# Patient Record
Sex: Female | Born: 1990 | Race: White | Hispanic: No | Smoking: Never smoker
Health system: Southern US, Community
[De-identification: ages and names within clinical notes are randomized; demographics above are authoritative.]

## PROBLEM LIST (undated history)

## (undated) DIAGNOSIS — A749 Chlamydial infection, unspecified: Secondary | ICD-10-CM

## (undated) DIAGNOSIS — G43109 Migraine with aura, not intractable, without status migrainosus: Secondary | ICD-10-CM

## (undated) DIAGNOSIS — F329 Major depressive disorder, single episode, unspecified: Secondary | ICD-10-CM

## (undated) DIAGNOSIS — L309 Dermatitis, unspecified: Secondary | ICD-10-CM

## (undated) DIAGNOSIS — R51 Headache: Secondary | ICD-10-CM

## (undated) DIAGNOSIS — R519 Headache, unspecified: Secondary | ICD-10-CM

## (undated) DIAGNOSIS — F32A Depression, unspecified: Secondary | ICD-10-CM

## (undated) DIAGNOSIS — N750 Cyst of Bartholin's gland: Secondary | ICD-10-CM

## (undated) DIAGNOSIS — R87629 Unspecified abnormal cytological findings in specimens from vagina: Secondary | ICD-10-CM

## (undated) DIAGNOSIS — G43909 Migraine, unspecified, not intractable, without status migrainosus: Secondary | ICD-10-CM

## (undated) HISTORY — DX: Unspecified abnormal cytological findings in specimens from vagina: R87.629

## (undated) HISTORY — DX: Migraine with aura, not intractable, without status migrainosus: G43.109

## (undated) HISTORY — DX: Major depressive disorder, single episode, unspecified: F32.9

## (undated) HISTORY — PX: WISDOM TOOTH EXTRACTION: SHX21

## (undated) HISTORY — DX: Depression, unspecified: F32.A

---

## 2013-03-25 ENCOUNTER — Ambulatory Visit: Payer: BC Managed Care – PPO | Admitting: Family Medicine

## 2013-03-25 VITALS — BP 120/68 | HR 72 | Temp 98.3°F | Resp 16 | Ht 63.5 in | Wt 98.8 lb

## 2013-03-25 DIAGNOSIS — F32A Depression, unspecified: Secondary | ICD-10-CM | POA: Insufficient documentation

## 2013-03-25 DIAGNOSIS — F4010 Social phobia, unspecified: Secondary | ICD-10-CM

## 2013-03-25 DIAGNOSIS — F329 Major depressive disorder, single episode, unspecified: Secondary | ICD-10-CM

## 2013-03-25 DIAGNOSIS — F3289 Other specified depressive episodes: Secondary | ICD-10-CM

## 2013-03-25 MED ORDER — CITALOPRAM HYDROBROMIDE 20 MG PO TABS: 20.0000 mg | ORAL_TABLET | Freq: Every day | ORAL | Status: DC

## 2013-03-25 MED ORDER — CLONAZEPAM 0.5 MG PO TABS: 0.5000 mg | ORAL_TABLET | Freq: Two times a day (BID) | ORAL | Status: DC | PRN

## 2013-03-25 NOTE — Progress Notes (Signed)
Subjective: 23 year old lady who was referred over to urgent medical family care by Vernell LeepKen Frazier, licensed counselor. The patient has a long history of social anxiety and depression. She is very much a Haematologistloner. She does have a regular boyfriend who lives with her. They moved here from [place] several months ago. 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 She denies suicidal thoughts, though one time many years ago she made a token cutting on her arm. Does not smoke or use drugs. She has a brother, but does not really keep up with him. Her mother who she was not close to passed away last month, and her father lives somewhere in the [place] area but she doesn't really keep up with him either. She mostly goes to work and then go straight home and stays there. Rarely goes out with her boyfriend, and finds that even interacting with his friends is very anxiety provoking for her. She has not get much exercise. She does not have any spiritual background to speak of.  Objective: Appears generally healthy. She tears when cries easily. Her TMs are normal. Throat clear. Pupils a little dilated but constrict easily. Chest clear. Heart regular without murmurs.  Assessment: Social anxiety Depression  Plan: Citalopram 20 mg one daily Clonazepam 0.5 mg twice daily for short-term use only until citalopram is working well  Continue seeing a counselor  Followup here as needed or see me in 3 weeks

## 2013-03-25 NOTE — Patient Instructions (Signed)
Take the citalopram for anxiety and depression one every morning. It will take about 2 weeks before you note that it has seemed to improve things. This is the starting dose, and many do well on it, but I may need to titrate the dose him to your needs.  Take the clonazepam one half to one twice daily only when needed for acute anxieties. You might try using it in a little more regular fashion over the next couple of weeks until the citalopram can begin working.  Return at any time in the event of acute worsening. Or go to the emergency room or to your counselor.

## 2013-04-15 ENCOUNTER — Ambulatory Visit: Payer: BC Managed Care – PPO | Admitting: Family Medicine

## 2013-04-15 VITALS — BP 120/62 | HR 92 | Temp 98.6°F | Resp 16 | Ht 64.0 in | Wt 99.8 lb

## 2013-04-15 DIAGNOSIS — F3289 Other specified depressive episodes: Secondary | ICD-10-CM

## 2013-04-15 DIAGNOSIS — F329 Major depressive disorder, single episode, unspecified: Secondary | ICD-10-CM

## 2013-04-15 DIAGNOSIS — F411 Generalized anxiety disorder: Secondary | ICD-10-CM

## 2013-04-15 DIAGNOSIS — F32A Depression, unspecified: Secondary | ICD-10-CM

## 2013-04-15 NOTE — Patient Instructions (Addendum)
Exercise  Get enough sleep  See your counselor as scheduled  Return in 3 months.  Return sooner if needed.

## 2013-04-15 NOTE — Progress Notes (Signed)
Subjective: Patient is feeling better, though she still has some issues with anxiety and depression. She is just taking one half of a clonazepam at a time. She continues taking the citalopram, and says she is no longer crying. Her boyfriend says she's more outgoing. She is not exercising yet. We talked about that again.  Objective: Exam not done today  Assessment: Anxiety and depression, improved  Plan: Exercise Same medication. Did not give her any additional clonazepam at this time. Followup with her psychologist.

## 2013-07-29 ENCOUNTER — Ambulatory Visit (INDEPENDENT_AMBULATORY_CARE_PROVIDER_SITE_OTHER): Payer: BC Managed Care – PPO | Admitting: Family Medicine

## 2013-07-29 VITALS — BP 122/66 | HR 81 | Temp 98.0°F | Resp 16 | Ht 63.0 in | Wt 98.0 lb

## 2013-07-29 DIAGNOSIS — F32A Depression, unspecified: Secondary | ICD-10-CM

## 2013-07-29 DIAGNOSIS — F329 Major depressive disorder, single episode, unspecified: Secondary | ICD-10-CM

## 2013-07-29 DIAGNOSIS — F4010 Social phobia, unspecified: Secondary | ICD-10-CM

## 2013-07-29 DIAGNOSIS — F3289 Other specified depressive episodes: Secondary | ICD-10-CM

## 2013-07-29 MED ORDER — CITALOPRAM HYDROBROMIDE 20 MG PO TABS: ORAL_TABLET | ORAL | Status: DC

## 2013-07-29 MED ORDER — CLONAZEPAM 0.5 MG PO TABS: 0.5000 mg | ORAL_TABLET | Freq: Two times a day (BID) | ORAL | Status: DC | PRN

## 2013-07-29 NOTE — Patient Instructions (Signed)
Continue current use of the clonazepam only on extremely needed basis for anxiety  Increase the citalopram to 30 mg (one and 1/2x20 mg) daily  Continue seeing her therapist  If you're doing well return late September

## 2013-07-29 NOTE — Progress Notes (Signed)
Subjective: A says her depression is still bothering her. Anxiety is improved considerably. Her boyfriend feels like the depression is less pronounced than it was. She is not suicidal. She does have a hard time getting going in the morning. She is able to force himself to hurt up and she does well at work. Sleeps well. Says she may sleep too much. Continue seeing her counselor.   Objective: Not examined her today. We had a long talk.  Assessment: Anxiety and depression  Plan: Increase the citalopram to 30 mg daily. She is concerned she doesn't have good appetite. If it persists we may have to switch her to Paxil. I don't want to gain a lot of weight. Return in 3 months

## 2013-12-01 ENCOUNTER — Other Ambulatory Visit: Payer: Self-pay | Admitting: Family Medicine

## 2015-02-05 NOTE — L&D Delivery Note (Signed)
Operative Delivery Note At 1:52 AM a viable female was delivered via Vaginal, Vacuum (Kiwi).  Presentation: vertex; Position: Right,, Occiput,, Anterior.  Verbal consent: obtained from patient.  Risks and benefits discussed in detail.  Risks include, but are not limited to the risks of anesthesia, bleeding, infection, damage to maternal tissues, fetal cephalhematoma.  There is also the risk of inability to effect vaginal delivery of the head, or shoulder dystocia that cannot be resolved by established maneuvers, leading to the need for emergency cesarean section.  Vacuum assisted x 3 sets.  No pop offs.  No scalp lacerations. APGAR: 9, 9; weight pending .   Placenta status:Intact, 3VC , .   Cord:  with the following complications:Tight right foot cord .  Cord pH: n/a  Anesthesia:  Epidural Instruments: Kiwi Episiotomy: None Lacerations: 2nd degree;Periurethral (actively bleeding) Suture Repair: 3.0 vicryl Est. Blood Loss (mL): 250  Mom to postpartum.  Baby to Couplet care / Skin to Skin.  Geryl RankinsVARNADO, Raesean Bartoletti 11/23/2015, 2:47 AM

## 2015-11-22 ENCOUNTER — Encounter (HOSPITAL_COMMUNITY): Payer: Self-pay | Admitting: *Deleted

## 2015-11-22 ENCOUNTER — Inpatient Hospital Stay (HOSPITAL_COMMUNITY): Payer: BLUE CROSS/BLUE SHIELD | Admitting: Anesthesiology

## 2015-11-22 ENCOUNTER — Inpatient Hospital Stay (HOSPITAL_COMMUNITY)
Admission: AD | Admit: 2015-11-22 | Discharge: 2015-11-24 | DRG: 775 | Disposition: A | Discharge status: Home / Self Care | Payer: BLUE CROSS/BLUE SHIELD | Source: Ambulatory Visit | Attending: Obstetrics and Gynecology | Admitting: Obstetrics and Gynecology

## 2015-11-22 DIAGNOSIS — O99824 Streptococcus B carrier state complicating childbirth: Secondary | ICD-10-CM | POA: Diagnosis present

## 2015-11-22 DIAGNOSIS — Z6791 Unspecified blood type, Rh negative: Secondary | ICD-10-CM

## 2015-11-22 DIAGNOSIS — Z3403 Encounter for supervision of normal first pregnancy, third trimester: Secondary | ICD-10-CM | POA: Diagnosis present

## 2015-11-22 DIAGNOSIS — D649 Anemia, unspecified: Secondary | ICD-10-CM | POA: Diagnosis present

## 2015-11-22 DIAGNOSIS — O9081 Anemia of the puerperium: Secondary | ICD-10-CM | POA: Diagnosis present

## 2015-11-22 DIAGNOSIS — Z3A39 39 weeks gestation of pregnancy: Secondary | ICD-10-CM

## 2015-11-22 DIAGNOSIS — O26893 Other specified pregnancy related conditions, third trimester: Secondary | ICD-10-CM | POA: Diagnosis present

## 2015-11-22 HISTORY — DX: Chlamydial infection, unspecified: A74.9

## 2015-11-22 HISTORY — DX: Cyst of Bartholin's gland: N75.0

## 2015-11-22 HISTORY — DX: Headache: R51

## 2015-11-22 HISTORY — DX: Headache, unspecified: R51.9

## 2015-11-22 HISTORY — DX: Dermatitis, unspecified: L30.9

## 2015-11-22 LAB — CBC
HEMATOCRIT: 32.3 % — AB (ref 36.0–46.0)
HEMOGLOBIN: 10.9 g/dL — AB (ref 12.0–15.0)
MCH: 27.3 pg (ref 26.0–34.0)
MCHC: 33.7 g/dL (ref 30.0–36.0)
MCV: 81 fL (ref 78.0–100.0)
Platelets: 218 10*3/uL (ref 150–400)
RBC: 3.99 MIL/uL (ref 3.87–5.11)
RDW: 13.9 % (ref 11.5–15.5)
WBC: 12.7 10*3/uL — ABNORMAL HIGH (ref 4.0–10.5)

## 2015-11-22 LAB — OB RESULTS CONSOLE GC/CHLAMYDIA
Chlamydia: NEGATIVE
Gonorrhea: NEGATIVE

## 2015-11-22 LAB — OB RESULTS CONSOLE HIV ANTIBODY (ROUTINE TESTING): HIV: NONREACTIVE

## 2015-11-22 LAB — OB RESULTS CONSOLE GBS: GBS: POSITIVE

## 2015-11-22 LAB — OB RESULTS CONSOLE HEPATITIS B SURFACE ANTIGEN: Hepatitis B Surface Ag: NEGATIVE

## 2015-11-22 LAB — AMNISURE RUPTURE OF MEMBRANE (ROM) NOT AT ARMC: Amnisure ROM: NEGATIVE

## 2015-11-22 LAB — OB RESULTS CONSOLE RPR: RPR: NONREACTIVE

## 2015-11-22 LAB — OB RESULTS CONSOLE RUBELLA ANTIBODY, IGM: Rubella: IMMUNE

## 2015-11-22 LAB — OB RESULTS CONSOLE ANTIBODY SCREEN: Antibody Screen: NEGATIVE

## 2015-11-22 LAB — OB RESULTS CONSOLE ABO/RH: RH Type: NEGATIVE

## 2015-11-22 MED ORDER — PHENYLEPHRINE 40 MCG/ML (10ML) SYRINGE FOR IV PUSH (FOR BLOOD PRESSURE SUPPORT)
80.0000 ug | PREFILLED_SYRINGE | INTRAVENOUS | Status: DC | PRN
  Filled 2015-11-22: qty 10
  Filled 2015-11-22: qty 5

## 2015-11-22 MED ORDER — EPHEDRINE 5 MG/ML INJ
10.0000 mg | INTRAVENOUS | Status: DC | PRN
  Filled 2015-11-22: qty 4

## 2015-11-22 MED ORDER — OXYTOCIN 40 UNITS IN LACTATED RINGERS INFUSION - SIMPLE MED
2.5000 [IU]/h | INTRAVENOUS | Status: DC
  Administered 2015-11-23: 2.5 [IU]/h via INTRAVENOUS
  Filled 2015-11-22: qty 1000

## 2015-11-22 MED ORDER — ACETAMINOPHEN 325 MG PO TABS: 650.0000 mg | ORAL_TABLET | ORAL | Status: DC | PRN

## 2015-11-22 MED ORDER — LIDOCAINE HCL (PF) 1 % IJ SOLN
INTRAMUSCULAR | Status: DC | PRN
  Administered 2015-11-22 (×2): 7 mL via EPIDURAL

## 2015-11-22 MED ORDER — SOD CITRATE-CITRIC ACID 500-334 MG/5ML PO SOLN: 30.0000 mL | ORAL | Status: DC | PRN

## 2015-11-22 MED ORDER — OXYCODONE-ACETAMINOPHEN 5-325 MG PO TABS
2.0000 | ORAL_TABLET | ORAL | Status: DC | PRN
  Administered 2015-11-23: 2 via ORAL
  Filled 2015-11-22: qty 2

## 2015-11-22 MED ORDER — LIDOCAINE HCL (PF) 1 % IJ SOLN
30.0000 mL | INTRAMUSCULAR | Status: DC | PRN
  Filled 2015-11-22: qty 30

## 2015-11-22 MED ORDER — LACTATED RINGERS IV SOLN
500.0000 mL | Freq: Once | INTRAVENOUS | Status: AC
  Administered 2015-11-22: 500 mL via INTRAVENOUS

## 2015-11-22 MED ORDER — FENTANYL 2.5 MCG/ML BUPIVACAINE 1/10 % EPIDURAL INFUSION (WH - ANES)
14.0000 mL/h | INTRAMUSCULAR | Status: DC | PRN
  Administered 2015-11-22 (×3): 14 mL/h via EPIDURAL
  Filled 2015-11-22 (×2): qty 125

## 2015-11-22 MED ORDER — PENICILLIN G POTASSIUM 5000000 UNITS IJ SOLR
5.0000 10*6.[IU] | Freq: Once | INTRAVENOUS | Status: AC
  Administered 2015-11-22: 5 10*6.[IU] via INTRAVENOUS
  Filled 2015-11-22: qty 5

## 2015-11-22 MED ORDER — OXYCODONE-ACETAMINOPHEN 5-325 MG PO TABS: 1.0000 | ORAL_TABLET | ORAL | Status: DC | PRN

## 2015-11-22 MED ORDER — LACTATED RINGERS IV SOLN
500.0000 mL | INTRAVENOUS | Status: DC | PRN
  Administered 2015-11-22: 1000 mL via INTRAVENOUS

## 2015-11-22 MED ORDER — PENICILLIN G POTASSIUM 5000000 UNITS IJ SOLR
2.5000 10*6.[IU] | INTRAVENOUS | Status: DC
  Administered 2015-11-22 – 2015-11-23 (×3): 2.5 10*6.[IU] via INTRAVENOUS
  Filled 2015-11-22 (×6): qty 2.5

## 2015-11-22 MED ORDER — PHENYLEPHRINE 40 MCG/ML (10ML) SYRINGE FOR IV PUSH (FOR BLOOD PRESSURE SUPPORT)
80.0000 ug | PREFILLED_SYRINGE | INTRAVENOUS | Status: DC | PRN
  Filled 2015-11-22: qty 5

## 2015-11-22 MED ORDER — DIPHENHYDRAMINE HCL 50 MG/ML IJ SOLN: 12.5000 mg | INTRAMUSCULAR | Status: DC | PRN

## 2015-11-22 MED ORDER — ONDANSETRON HCL 4 MG/2ML IJ SOLN
4.0000 mg | Freq: Four times a day (QID) | INTRAMUSCULAR | Status: DC | PRN
  Administered 2015-11-22: 4 mg via INTRAVENOUS
  Filled 2015-11-22: qty 2

## 2015-11-22 MED ORDER — OXYTOCIN BOLUS FROM INFUSION
500.0000 mL | Freq: Once | INTRAVENOUS | Status: AC
  Administered 2015-11-23: 500 mL via INTRAVENOUS

## 2015-11-22 MED ORDER — LACTATED RINGERS IV SOLN
INTRAVENOUS | Status: DC
  Administered 2015-11-22: 14:00:00 via INTRAVENOUS

## 2015-11-22 NOTE — Progress Notes (Signed)
Dr Normand Sloopillard notified of pt's VE, orders received to admit pt

## 2015-11-22 NOTE — Progress Notes (Signed)
Rebekah [name] MRN: [id number]  Subjective: -Pushing with patient.  Multiple position changes.  Good efforts made.  Objective: BP 119/66   Pulse 94   Temp 98 F (36.7 C) (Oral)   Resp 16   Ht 5\' 3"  (1.6 m)   Wt 63 kg (139 lb) Comment: in office  LMP 02/19/2015   SpO2 98%   BMI 24.62 kg/m  No intake/output data recorded. Total I/O In: -  Out: 600 [Urine:600]  Fetal Monitoring: FHT: 140 bpm, Mod Var, +Early Decels, +Accels UC: Palpates moderate, Q722minutes    Vaginal Exam: SVE:   Dilation: 10 Effacement (%): 100 Station: +1, +2 Exam by:: Sabas SousJ Beckham Capistran, CNM Membranes:AROM Internal Monitors: None  Augmentation/Induction: Pitocin:None Cytotec: None  Assessment:  IUP at 39.3wks Cat I FT  2nd Stage Labor GBS Positive  Plan: -Continue pushing -Rest when necessary -Ice chips as needed -Continue other mgmt as ordered  Valma CavaJessica L Trestan Vahle,MSN, CNM 11/22/2015, 11:25 PM

## 2015-11-22 NOTE — Anesthesia Pain Management Evaluation Note (Signed)
  [facility] Pain Management Visit Note  Patient: Jarold Songicole Bardales, 25 y.o., female  "Hello I am a member of the anesthesia team at [facility]. We have an anesthesia team available at all times to provide care throughout the hospital, including epidural management and anesthesia for C-section. I don't know your plan for the delivery whether it a natural birth, water birth, IV sedation, nitrous supplementation, doula or epidural, but we want to meet your pain goals."   1.Was your pain managed to your expectations on prior hospitalizations?   No prior hospitalizations  2.What is your expectation for pain management during this hospitalization?     Epidural  3.How can we help you reach that goal? Epidural when pt ready  Record the patient's initial score and the patient's pain goal.   Pain: 6  Pain Goal: 7 [facility] wants you to be able to say your pain was always managed very well.  Edison PaceWILKERSON,Jarrod Bodkins 11/22/2015

## 2015-11-22 NOTE — Anesthesia Preprocedure Evaluation (Addendum)
Anesthesia Evaluation  Patient identified by MRN, date of birth, ID band Patient awake    Reviewed: Allergy & Precautions, H&P , NPO status , Patient's Chart, lab work & pertinent test results  Airway Mallampati: I  TM Distance: >3 FB Neck ROM: full    Dental no notable dental hx.    Pulmonary neg pulmonary ROS,    Pulmonary exam normal        Cardiovascular negative cardio ROS Normal cardiovascular exam     Neuro/Psych    GI/Hepatic negative GI ROS, Neg liver ROS,   Endo/Other  negative endocrine ROS  Renal/GU negative Renal ROS     Musculoskeletal   Abdominal Normal abdominal exam  (+)   Peds  Hematology negative hematology ROS (+)   Anesthesia Other Findings   Reproductive/Obstetrics (+) Pregnancy                             Anesthesia Physical Anesthesia Plan  ASA: II  Anesthesia Plan: Epidural   Post-op Pain Management:    Induction:   Airway Management Planned:   Additional Equipment:   Intra-op Plan:   Post-operative Plan:   Informed Consent: I have reviewed the patients History and Physical, chart, labs and discussed the procedure including the risks, benefits and alternatives for the proposed anesthesia with the patient or authorized representative who has indicated his/her understanding and acceptance.     Plan Discussed with:   Anesthesia Plan Comments:         Anesthesia Quick Evaluation  

## 2015-11-22 NOTE — Progress Notes (Signed)
Jarold Songicole Quirion MRN: [id number]  Subjective: -Care assumed of 25 y.o. G1P0 at 39.3wks who presented in active labor.  Patient GBS positive and labor has been progressive without intervention with exception of AROM at 1700. In room to meet acquaintance and discuss POC for tonight.  Patient reports no concerns and denies current pressure or discomfort.   Objective: BP 117/67   Pulse 80   Temp 98 F (36.7 C) (Oral)   Resp 18   Ht 5\' 3"  (1.6 m)   Wt 63 kg (139 lb) Comment: in office  LMP 02/19/2015   SpO2 98%   BMI 24.62 kg/m  No intake/output data recorded. No intake/output data recorded.  Fetal Monitoring: FHT: 135 bpm, Mod Var, -Decels, +Accels UC: Palpates moderate    Vaginal Exam: SVE:   Dilation: 9 Effacement (%): 90 Station: +1 Exam by:: J.Thornton, RN  Membranes:AROM Internal Monitors: None  Augmentation/Induction: Pitocin:None Cytotec: None  Assessment:  IUP at 39.3wks Cat I FT  GBS Positive Transitional Labor  Plan: -Adjust toco to attempt to graph contractions -Place 'peanut' to promote fetal descent and rotation -Continue other mgmt as ordered -Anticipate SVD  Valma CavaJessica L Lashelle Koy,MSN, CNM 11/22/2015, 7:49 PM

## 2015-11-22 NOTE — H&P (Signed)
Rebekah [name] is [name] 25 y.o. female presenting for labor. OB History    Gravida Para Term Preterm AB Living   1 0 0 0 0 0   SAB TAB Ectopic Multiple Live Births   0 0 0 0 0     Past Medical History:  Diagnosis Date  . Bartholin's cyst   . Chlamydia   . Depression   . Eczema   . Headache    Past Surgical History:  Procedure Laterality Date  . WISDOM TOOTH EXTRACTION     Family History: family history includes Cancer in her mother. Social History:  reports that she has never smoked. She has never used smokeless tobacco. She reports that she does not drink alcohol or use drugs.     Maternal Diabetes: No Genetic Screening: Declined Maternal Ultrasounds/Referrals: Normal Fetal Ultrasounds or other Referrals:  None Maternal Substance Abuse:  No Significant Maternal Medications:  None Significant Maternal Lab Results:  Lab values include: Rh negative Other Comments:  None  ROS History Dilation: 4.5 Effacement (%): 90 Station: -3 Exam by:: Ginger Morris Rn Blood pressure 127/72, pulse 77, temperature 98.2 F (36.8 C), temperature source Oral, resp. rate 18, height 5\' 3"  (1.6 m), weight 139 lb (63 kg), last menstrual period 02/19/2015, SpO2 98 %. Exam Physical Exam  Physical Examination: General appearance - alert, well appearing, and in no distress Chest - clear to auscultation, no wheezes, rales or rhonchi, symmetric air entry Heart - normal rate and regular rhythm Abdomen - soft, nontender, nondistended, no masses or organomegaly Extremities - peripheral pulses normal, no pedal edema, no clubbing or cyanosis, Homan's sign negative bilaterally Prenatal labs: ABO, Rh: B/Negative/-- (10/18 1219) Antibody: Negative (10/18 1219) Rubella: Immune (10/18 1219) RPR: Nonreactive (10/18 1219)  HBsAg: Negative (10/18 1219)  HIV: Non-reactive (10/18 1219)  GBS: Positive (10/18 1219)   Assessment/Plan: Term in labor Epidural for pain Anticipate SVD PCN for  GBS   Rebekah [name] 11/22/2015, 3:01 PM

## 2015-11-22 NOTE — MAU Note (Addendum)
Pt sent from office, has been contracting since 0100, cx was 3+/100/-2 with a BBOW.  Pos GBS.  Spoke with Dr Normand Sloopillard regarding POC, do amnisure, if neg pt to walk and be rechecked.  Reports increase in d/c, and blood streaked mucous noted after exam.

## 2015-11-22 NOTE — Anesthesia Procedure Notes (Signed)
Epidural Patient location during procedure: OB Start time: 11/22/2015 2:25 PM End time: 11/22/2015 2:31 PM  Staffing Anesthesiologist: Leilani AbleHATCHETT, Modesty Rudy Performed: anesthesiologist   Preanesthetic Checklist Completed: patient identified, surgical consent, pre-op evaluation, timeout performed, IV checked, risks and benefits discussed and monitors and equipment checked  Epidural Patient position: sitting Prep: site prepped and draped and DuraPrep Patient monitoring: continuous pulse ox and blood pressure Approach: midline Location: L3-L4 Injection technique: LOR air  Needle:  Needle type: Tuohy  Needle gauge: 17 G Needle length: 9 cm and 9 Needle insertion depth: 5 cm cm Catheter type: closed end flexible Catheter size: 19 Gauge Catheter at skin depth: 10 cm Test dose: negative and Other  Assessment Sensory level: T9 Events: blood not aspirated, injection not painful, no injection resistance, negative IV test and no paresthesia  Additional Notes Reason for block:procedure for pain

## 2015-11-22 NOTE — Progress Notes (Signed)
Rebekah [name] MRN: [id number]  Subjective: -Nurse call reports patient with c/o constant pressure and C/C/+2 exam. In room to assess. Patient resting in bed.   Objective: BP 119/66   Pulse 94   Temp 97.7 F (36.5 C) (Oral)   Resp 16   Ht 5\' 3"  (1.6 m)   Wt 63 kg (139 lb) Comment: in office  LMP 02/19/2015   SpO2 98%   BMI 24.62 kg/m  No intake/output data recorded. Total I/O In: -  Out: 600 [Urine:600]  Fetal Monitoring: FHT: 145 bpm, Mod Var, -Decels, +Accels UC: Palpates moderate    Vaginal Exam: SVE:   Dilation: 10 Effacement (%): 100 Station: +1, +2 Exam by:: Rebekah [name], CNM Membranes:AROM Internal Monitors: None  Augmentation/Induction: Pitocin:None Cytotec: None  Assessment:  IUP at 39.3wks Cat I FT  2nd Stage Labor GBS Positive S/P 3 Doses PCN  Plan: -Practice pushes completed with minimal effort -Place in high fowlers and allow to labor down x 30 to 60 minutes -Continue other mgmt as ordered  Rebekah CavaJessica L Luretta Everly,MSN, CNM 11/22/2015, 9:44 PM

## 2015-11-22 NOTE — Progress Notes (Signed)
Patient ID: Rebekah [name], female   DOB: [birth year], 25 y.o.   MRN: [id number]  BP 124/64   Pulse 73   Temp 98.2 F (36.8 C) (Oral)   Resp 18   Ht 5\' 3"  (1.6 m)   Wt 139 lb (63 kg) Comment: in office  LMP 02/19/2015   SpO2 98%   BMI 24.62 kg/m  Cat 1 6/90/0 AROM CLEAR Comfortable with epidural Anticipate SVD

## 2015-11-22 NOTE — Anesthesia Procedure Notes (Signed)
Epidural Patient location during procedure: OB Start time: 11/22/2015 3:25 PM End time: 11/22/2015 3:30 PM  Staffing Anesthesiologist: Jennel Mara Performed: anesthesiologist   Preanesthetic Checklist Completed: patient identified, surgical consent, pre-op evaluation, timeout performed, IV checked, risks and benefits discussed and monitors and equipment checked  Epidural Patient position: sitting Prep: site prepped and draped and DuraPrep Patient monitoring: continuous pulse ox and blood pressure Approach: midline Location: L3-L4 Injection technique: LOR air  Needle:  Needle type: Tuohy  Needle gauge: 17 G Needle length: 9 cm and 9 Needle insertion depth: 5 cm cm Catheter type: closed end flexible Catheter size: 19 Gauge Catheter at skin depth: 10 cm Test dose: negative and Other  Assessment Sensory level: T9 Events: blood not aspirated, injection not painful, no injection resistance, negative IV test and no paresthesia  Additional Notes Reason for block:procedure for pain     

## 2015-11-23 ENCOUNTER — Encounter (HOSPITAL_COMMUNITY): Payer: Self-pay

## 2015-11-23 LAB — CBC
HEMATOCRIT: 27.4 % — AB (ref 36.0–46.0)
HEMOGLOBIN: 9.4 g/dL — AB (ref 12.0–15.0)
MCH: 27.8 pg (ref 26.0–34.0)
MCHC: 34.3 g/dL (ref 30.0–36.0)
MCV: 81.1 fL (ref 78.0–100.0)
PLATELETS: 209 10*3/uL (ref 150–400)
RBC: 3.38 MIL/uL — AB (ref 3.87–5.11)
RDW: 14 % (ref 11.5–15.5)
WBC: 23.4 10*3/uL — AB (ref 4.0–10.5)

## 2015-11-23 LAB — RPR: RPR: NONREACTIVE

## 2015-11-23 MED ORDER — DIBUCAINE 1 % RE OINT: 1.0000 "application " | TOPICAL_OINTMENT | RECTAL | Status: DC | PRN

## 2015-11-23 MED ORDER — TERBUTALINE SULFATE 1 MG/ML IJ SOLN
0.2500 mg | Freq: Once | INTRAMUSCULAR | Status: DC | PRN
  Filled 2015-11-23: qty 1

## 2015-11-23 MED ORDER — SENNOSIDES-DOCUSATE SODIUM 8.6-50 MG PO TABS
2.0000 | ORAL_TABLET | ORAL | Status: DC
  Administered 2015-11-24: 2 via ORAL
  Filled 2015-11-23: qty 2

## 2015-11-23 MED ORDER — ACETAMINOPHEN 325 MG PO TABS: 650.0000 mg | ORAL_TABLET | ORAL | Status: DC | PRN

## 2015-11-23 MED ORDER — PRENATAL MULTIVITAMIN CH
1.0000 | ORAL_TABLET | Freq: Every day | ORAL | Status: DC
  Administered 2015-11-23 – 2015-11-24 (×2): 1 via ORAL
  Filled 2015-11-23 (×2): qty 1

## 2015-11-23 MED ORDER — COCONUT OIL OIL
1.0000 "application " | TOPICAL_OIL | Status: DC | PRN
  Administered 2015-11-24: 1 via TOPICAL
  Filled 2015-11-23: qty 120

## 2015-11-23 MED ORDER — WITCH HAZEL-GLYCERIN EX PADS: 1.0000 "application " | MEDICATED_PAD | CUTANEOUS | Status: DC | PRN

## 2015-11-23 MED ORDER — ONDANSETRON HCL 4 MG PO TABS: 4.0000 mg | ORAL_TABLET | ORAL | Status: DC | PRN

## 2015-11-23 MED ORDER — ZOLPIDEM TARTRATE 5 MG PO TABS: 5.0000 mg | ORAL_TABLET | Freq: Every evening | ORAL | Status: DC | PRN

## 2015-11-23 MED ORDER — RHO D IMMUNE GLOBULIN 1500 UNIT/2ML IJ SOSY
300.0000 ug | PREFILLED_SYRINGE | Freq: Once | INTRAMUSCULAR | Status: AC
  Administered 2015-11-23: 300 ug via INTRAVENOUS
  Filled 2015-11-23: qty 2

## 2015-11-23 MED ORDER — DIPHENHYDRAMINE HCL 25 MG PO CAPS: 25.0000 mg | ORAL_CAPSULE | Freq: Four times a day (QID) | ORAL | Status: DC | PRN

## 2015-11-23 MED ORDER — BENZOCAINE-MENTHOL 20-0.5 % EX AERO
1.0000 "application " | INHALATION_SPRAY | CUTANEOUS | Status: DC | PRN
  Administered 2015-11-23: 1 via TOPICAL
  Filled 2015-11-23: qty 56

## 2015-11-23 MED ORDER — TETANUS-DIPHTH-ACELL PERTUSSIS 5-2.5-18.5 LF-MCG/0.5 IM SUSP
0.5000 mL | Freq: Once | INTRAMUSCULAR | Status: DC
Start: 2015-11-24 — End: 2015-11-24

## 2015-11-23 MED ORDER — ONDANSETRON HCL 4 MG/2ML IJ SOLN: 4.0000 mg | INTRAMUSCULAR | Status: DC | PRN

## 2015-11-23 MED ORDER — IBUPROFEN 600 MG PO TABS
600.0000 mg | ORAL_TABLET | Freq: Four times a day (QID) | ORAL | Status: DC
  Administered 2015-11-23 – 2015-11-24 (×7): 600 mg via ORAL
  Filled 2015-11-23 (×8): qty 1

## 2015-11-23 MED ORDER — OXYTOCIN 40 UNITS IN LACTATED RINGERS INFUSION - SIMPLE MED
1.0000 m[IU]/min | INTRAVENOUS | Status: DC
  Administered 2015-11-23: 2 m[IU]/min via INTRAVENOUS

## 2015-11-23 MED ORDER — SIMETHICONE 80 MG PO CHEW: 80.0000 mg | CHEWABLE_TABLET | ORAL | Status: DC | PRN

## 2015-11-23 NOTE — Lactation Note (Addendum)
This note was copied from a baby's chart. Lactation Consultation Note  Patient Name: Rebekah Jarold Songicole Caul IONGE'XToday's Date: 11/23/2015 Reason for consult: Initial assessment  Initial visit at 14 hours of life. Mom reports + breast changes w/pregnancy.   Mom assisted w/latch, using the teacup hold. Infant latched w/ease. Mom is now able to identify swallows. Specifics of an asymmetric latch shown via [employer] website animation. Pacifier noted on side table; I encouraged her not to use it at this time (especially in light of ABO/Rh set-up).   Mom made aware of O/P services, breastfeeding support groups, community resources, and our phone # for post-discharge questions. Mom has a Medela DEBP through her insurance.  Mom states she has not taken Celexa or Klonopin in years.   Lurline HareRichey, Corinna Burkman [facility] 11/23/2015, 4:14 PM

## 2015-11-23 NOTE — Progress Notes (Signed)
Jarold Songicole Fridman MRN: [id number]  Subjective: -Patient continues to push with good efforts. Reports lower abdominal pressure/cramping, but no rectal pressure.  Dr. Enid BaasEV at bedside  Objective: BP (!) 98/57   Pulse 90   Temp 98.5 F (36.9 C) (Oral)   Resp 20   Ht 5\' 3"  (1.6 m)   Wt 63 kg (139 lb) Comment: in office  LMP 02/19/2015   SpO2 98%   BMI 24.62 kg/m  No intake/output data recorded. Total I/O In: -  Out: 600 [Urine:600]  Fetal Monitoring: FHT: 145 bpm, Mod Var, -Decels, +Accels UC: Palpates moderate to strong, Irregular    Vaginal Exam: SVE:   Dilation: 10 Effacement (%): 100 Station: +1, +2 Exam by:: Sabas SousJ Kaynen Minner, CNM Membranes:AROM Internal Monitors: None  Augmentation/Induction: Pitocin:Initiated Cytotec: None  Assessment:  IUP at 39.4wks Cat I FT  2nd Stage Labor Labor Augmentation AFebrile  Plan: -Discussed options including pitocin augmentation, vacuum assist, or cesarean section -Patient desires continued pushing with pitocin augmentation -No questions or concerns -Discussed continued pushing for one hour and reevaluation -Continue other mgmt as ordered  Valma CavaJessica L Bailea Beed,MSN, CNM 11/23/2015, 1:17 AM

## 2015-11-23 NOTE — Progress Notes (Signed)
In room to assess pt at the request of CNM Gerrit HeckJessica Emly.  Pt has been pushing ~ 2.5 hours.  Feels she has adequate pelvis but baby is likely OP.  They have done several position changes to facilitate rotation.  Pt appears to be tired. Pushing well. Station +2, ROP, asynclitic.  Adequate pelvis.  Some descent with pushing  Pt counseled on presentation of baby.  Options are to continue pushing with addition of Pitocin, vacuum or cesarean section. Risk of cephalohematoma, shoulder dystocia.  Pt and family given some time to discuss.

## 2015-11-23 NOTE — Anesthesia Postprocedure Evaluation (Signed)
Anesthesia Post Note  Patient: Rebekah [name]  Procedure(s) Performed: * No procedures listed *  Patient location during evaluation: Mother Baby Anesthesia Type: Epidural Level of consciousness: awake Pain management: pain level controlled Vital Signs Assessment: post-procedure vital signs reviewed and stable Respiratory status: spontaneous breathing Cardiovascular status: stable Postop Assessment: no headache, no backache, epidural receding and patient able to bend at knees Anesthetic complications: no     Last Vitals:  Vitals:   11/23/15 0445 11/23/15 0611  BP: (!) 114/54 (!) 119/59  Pulse: 79 83  Resp: 18 18  Temp: 36.9 C 36.4 C    Last Pain:  Vitals:   11/23/15 0611  TempSrc: Oral  PainSc: 0-No pain   Pain Goal:                 Edison PaceWILKERSON,Olander Friedl

## 2015-11-24 LAB — RH IG WORKUP (INCLUDES ABO/RH)
ABO/RH(D): B NEG
FETAL SCREEN: NEGATIVE
GESTATIONAL AGE(WKS): 39
Unit division: 0

## 2015-11-24 MED ORDER — IBUPROFEN 600 MG PO TABS: 600.0000 mg | ORAL_TABLET | Freq: Four times a day (QID) | ORAL | 2 refills | Status: DC

## 2015-11-24 NOTE — Discharge Summary (Signed)
OB Discharge Summary     Patient Name: Rebekah [name] DOB: 12/08/1990 MRN: [id number]  Date of admission: 11/22/2015 Delivering MD: Geryl Rankins   Date of discharge: 11/24/2015  Admitting diagnosis: LABOR CHECK Intrauterine pregnancy: [redacted]w[redacted]d     Secondary diagnosis:  Active Problems:   Labor and delivery indication for care or intervention   Vacuum extraction, delivered, current hospitalization   Obstetrical laceration   Second-degree perineal laceration, with delivery  Additional problems: None     Discharge diagnosis: Term Pregnancy Delivered and Vacuum Assisted Vaginal Delivery                                                                                                Post partum procedures:None  Augmentation: AROM and Pitocin  Complications: None  Hospital course:  Onset of Labor With Vaginal Delivery     25 y.o. yo G1P1001 at 106w4d was admitted in Active Labor on 11/22/2015. Patient had an uncomplicated labor course as follows:  Membrane Rupture Time/Date: 4:35 PM ,11/22/2015   Intrapartum Procedures: Episiotomy: None [1]                                         Lacerations:  2nd degree [3];Periurethral [8]  Patient had a delivery of a Viable infant. 11/23/2015  Information for the patient's newborn:  Moller, Girl Kryslyn [[id number]]  Delivery Method: Vaginal, Vacuum (Extractor) (Filed from Delivery Summary)    Pateint had an uncomplicated postpartum course.  She is ambulating, tolerating a regular diet, passing flatus, and urinating well. Patient is discharged home in stable condition on 11/24/15.    Physical exam Vitals:   11/23/15 1750 11/24/15 0606 11/24/15 0900 11/24/15 1813  BP: 120/60 110/64 114/64 133/66  Pulse: 83 60 74 83  Resp: 18 18 20 18   Temp: 99 F (37.2 C) 98.2 F (36.8 C) 98.1 F (36.7 C)   TempSrc: Oral Oral Oral   SpO2: 98%     Weight:      Height:       General: alert, cooperative and no distress Mood/Affect:  Appropriate/Bright Lungs: clear to auscultation, no wheezes, rales or rhonchi, symmetric air entry.  Heart: normal rate and regular rhythm. Breast: breasts appear normal, no suspicious masses, no skin or nipple changes or axillary nodes, breastfeeding. Abdomen:  + bowel sounds, Soft, Nontender Uterine Fundus: firm, U/-2 Lochia: appropriate Laceration: Not assessed Skin: Warm, Dry DVT Evaluation: No evidence of DVT seen on physical exam. No significant calf/ankle edema.  Labs: Lab Results  Component Value Date   WBC 23.4 (H) 11/23/2015   HGB 9.4 (L) 11/23/2015   HCT 27.4 (L) 11/23/2015   MCV 81.1 11/23/2015   PLT 209 11/23/2015   No flowsheet data found.  Discharge instruction: per After Visit Summary and "Baby and Me Booklet". Pain Management, Peri-Care, Breastfeeding, Who and When to call for postpartum complications. Information Sheet(s) given Contraception Choices, PPBB and Depression, Vaginal Care    After visit meds:    Medication List  STOP taking these medications   citalopram 20 MG tablet Commonly known as:  CELEXA   clonazePAM 0.5 MG tablet Commonly known as:  KLONOPIN   pantoprazole 20 MG tablet Commonly known as:  PROTONIX     TAKE these medications   ibuprofen 600 MG tablet Commonly known as:  ADVIL,MOTRIN Take 1 tablet (600 mg total) by mouth every 6 (six) hours. Start taking on:  11/25/2015   prenatal multivitamin Tabs tablet Take 1 tablet by mouth daily at 12 noon.       Diet: routine diet  Activity: Advance as tolerated. Pelvic rest for 6 weeks.   Outpatient follow up:6 weeks Follow up Appt:No future appointments. Follow up Visit:No Follow-up on file.  Postpartum contraception: Undecided  Newborn Data: Live born female  Birth Weight: 7 lb 3.2 oz (3265 g) APGAR: 9, 9  Baby Feeding: Breast Disposition:home with mother   11/24/2015 Cherre RobinsJessica L Melaney Tellefsen, CNM

## 2015-11-24 NOTE — Progress Notes (Signed)
Rebekah [name]  Post Partum Day 1:S/P VAVD with 2nd Degree Vaginal and Periurethral Laceration  Subjective: Patient up ad lib, denies syncope or dizziness. Reports consuming regular diet without issues and denies N/V. Denies issues with urination and reports bleeding is "getting lighter."  Patient is breastfeeding and reports going well now.  Desires postpartum contraception, but unsure of method.  Pain is being appropriately managed with use of ibuprofen.  Objective: Vitals:   11/23/15 0611 11/23/15 1000 11/23/15 1750 11/24/15 0606  BP: (!) 119/59 (!) 104/55 120/60 110/64  Pulse: 83 74 83 60  Resp: 18 18 18 18   Temp: 97.5 F (36.4 C) 97.6 F (36.4 C) 99 F (37.2 C) 98.2 F (36.8 C)  TempSrc: Oral Oral Oral Oral  SpO2:   98%   Weight:      Height:        Recent Labs  11/22/15 1150 11/23/15 0617  HGB 10.9* 9.4*  HCT 32.3* 27.4*    Physical Exam:  General: alert, cooperative and no distress Mood/Affect: Appropriate/Bright Lungs: clear to auscultation, no wheezes, rales or rhonchi, symmetric air entry.  Heart: normal rate and regular rhythm. Breast: breasts appear normal, no suspicious masses, no skin or nipple changes or axillary nodes, breastfeeding. Abdomen:  + bowel sounds, Soft, Nontender Uterine Fundus: firm, U/-2 Lochia: appropriate Laceration: Not assessed Skin: Warm, Dry DVT Evaluation: No evidence of DVT seen on physical exam. No significant calf/ankle edema.  Assessment S/P Vaginal Delivery-Day 1 Normal Involution BreastFeeding Asymptomatic Anemia  Plan: Discussed breastfeeding-assisted with latch and positioning Discussed birth control and pelvic rest  Continue current care Plan for discharge tomorrow Dr. Sallyanne HaversEK to be updated on patient status   Rebekah RobinsJessica L Janda Cargo, MSN, CNM 11/24/2015, 9:17 AM

## 2015-11-24 NOTE — Discharge Instructions (Signed)
Contraception Choices Contraception (birth control) is the use of any methods or devices to prevent pregnancy. Below are some methods to help avoid pregnancy. HORMONAL METHODS   Contraceptive implant. This is a thin, plastic tube containing progesterone hormone. It does not contain estrogen hormone. Your health care provider inserts the tube in the inner part of the upper arm. The tube can remain in place for up to 3 years. After 3 years, the implant must be removed. The implant prevents the ovaries from releasing an egg (ovulation), thickens the cervical mucus to prevent sperm from entering the uterus, and thins the lining of the inside of the uterus.  Progesterone-only injections. These injections are given every 3 months by your health care provider to prevent pregnancy. This synthetic progesterone hormone stops the ovaries from releasing eggs. It also thickens cervical mucus and changes the uterine lining. This makes it harder for sperm to survive in the uterus.  Birth control pills. These pills contain estrogen and progesterone hormone. They work by preventing the ovaries from releasing eggs (ovulation). They also cause the cervical mucus to thicken, preventing the sperm from entering the uterus. Birth control pills are prescribed by a health care provider.Birth control pills can also be used to treat heavy periods.  Minipill. This type of birth control pill contains only the progesterone hormone. They are taken every day of each month and must be prescribed by your health care provider.  Birth control patch. The patch contains hormones similar to those in birth control pills. It must be changed once a week and is prescribed by a health care provider.  Vaginal ring. The ring contains hormones similar to those in birth control pills. It is left in the vagina for 3 weeks, removed for 1 week, and then a new one is put back in place. The patient must be comfortable inserting and removing the ring  from the vagina.A health care provider's prescription is necessary.  Emergency contraception. Emergency contraceptives prevent pregnancy after unprotected sexual intercourse. This pill can be taken right after sex or up to 5 days after unprotected sex. It is most effective the sooner you take the pills after having sexual intercourse. Most emergency contraceptive pills are available without a prescription. Check with your pharmacist. Do not use emergency contraception as your only form of birth control. BARRIER METHODS   Female condom. This is a thin sheath (latex or rubber) that is worn over the penis during sexual intercourse. It can be used with spermicide to increase effectiveness.  Female condom. This is a soft, loose-fitting sheath that is put into the vagina before sexual intercourse.  Diaphragm. This is a soft, latex, dome-shaped barrier that must be fitted by a health care provider. It is inserted into the vagina, along with a spermicidal jelly. It is inserted before intercourse. The diaphragm should be left in the vagina for 6 to 8 hours after intercourse.  Cervical cap. This is a round, soft, latex or plastic cup that fits over the cervix and must be fitted by a health care provider. The cap can be left in place for up to 48 hours after intercourse.  Sponge. This is a soft, circular piece of polyurethane foam. The sponge has spermicide in it. It is inserted into the vagina after wetting it and before sexual intercourse.  Spermicides. These are chemicals that kill or block sperm from entering the cervix and uterus. They come in the form of creams, jellies, suppositories, foam, or tablets. They do not require a  prescription. They are inserted into the vagina with an applicator before having sexual intercourse. The process must be repeated every time you have sexual intercourse. INTRAUTERINE CONTRACEPTION  Intrauterine device (IUD). This is a T-shaped device that is put in a woman's uterus  during a menstrual period to prevent pregnancy. There are 2 types:  Copper IUD. This type of IUD is wrapped in copper wire and is placed inside the uterus. Copper makes the uterus and fallopian tubes produce a fluid that kills sperm. It can stay in place for 10 years.  Hormone IUD. This type of IUD contains the hormone progestin (synthetic progesterone). The hormone thickens the cervical mucus and prevents sperm from entering the uterus, and it also thins the uterine lining to prevent implantation of a fertilized egg. The hormone can weaken or kill the sperm that get into the uterus. It can stay in place for 3-5 years, depending on which type of IUD is used. PERMANENT METHODS OF CONTRACEPTION  Female tubal ligation. This is when the woman's fallopian tubes are surgically sealed, tied, or blocked to prevent the egg from traveling to the uterus.  Hysteroscopic sterilization. This involves placing a small coil or insert into each fallopian tube. Your doctor uses a technique called hysteroscopy to do the procedure. The device causes scar tissue to form. This results in permanent blockage of the fallopian tubes, so the sperm cannot fertilize the egg. It takes about 3 months after the procedure for the tubes to become blocked. You must use another form of birth control for these 3 months.  Female sterilization. This is when the female has the tubes that carry sperm tied off (vasectomy).This blocks sperm from entering the vagina during sexual intercourse. After the procedure, the man can still ejaculate fluid (semen). NATURAL PLANNING METHODS  Natural family planning. This is not having sexual intercourse or using a barrier method (condom, diaphragm, cervical cap) on days the woman could become pregnant.  Calendar method. This is keeping track of the length of each menstrual cycle and identifying when you are fertile.  Ovulation method. This is avoiding sexual intercourse during ovulation.  Symptothermal  method. This is avoiding sexual intercourse during ovulation, using a thermometer and ovulation symptoms.  Post-ovulation method. This is timing sexual intercourse after you have ovulated. Regardless of which type or method of contraception you choose, it is important that you use condoms to protect against the transmission of sexually transmitted infections (STIs). Talk with your health care provider about which form of contraception is most appropriate for you.   This information is not intended to replace advice given to you by your health care provider. Make sure you discuss any questions you have with your health care provider.   Document Released: 01/21/2005 Document Revised: 01/26/2013 Document Reviewed: 07/16/2012 Elsevier Interactive Patient Education 2016 [employer]. Postpartum Depression and Baby Blues The postpartum period begins right after the birth of a baby. During this time, there is often a great amount of joy and excitement. It is also a time of many changes in the life of the parents. Regardless of how many times a mother gives birth, each child brings new challenges and dynamics to the family. It is not unusual to have feelings of excitement along with confusing shifts in moods, emotions, and thoughts. All mothers are at risk of developing postpartum depression or the "baby blues." These mood changes can occur right after giving birth, or they may occur many months after giving birth. The baby blues or postpartum  depression can be mild or severe. Additionally, postpartum depression can go away rather quickly, or it can be a long-term condition.  CAUSES Raised hormone levels and the rapid drop in those levels are thought to be a main cause of postpartum depression and the baby blues. A number of hormones change during and after pregnancy. Estrogen and progesterone usually decrease right after the delivery of your baby. The levels of thyroid hormone and various cortisol steroids  also rapidly drop. Other factors that play a role in these mood changes include major life events and genetics.  RISK FACTORS If you have any of the following risks for the baby blues or postpartum depression, know what symptoms to watch out for during the postpartum period. Risk factors that may increase the likelihood of getting the baby blues or postpartum depression include:  Having a personal or family history of depression.   Having depression while being pregnant.   Having premenstrual mood issues or mood issues related to oral contraceptives.  Having a lot of life stress.   Having marital conflict.   Lacking a social support network.   Having a baby with special needs.   Having health problems, such as diabetes.  SIGNS AND SYMPTOMS Symptoms of baby blues include:  Brief changes in mood, such as going from extreme happiness to sadness.  Decreased concentration.   Difficulty sleeping.   Crying spells, tearfulness.   Irritability.   Anxiety.  Symptoms of postpartum depression typically begin within the first month after giving birth. These symptoms include:  Difficulty sleeping or excessive sleepiness.   Marked weight loss.   Agitation.   Feelings of worthlessness.   Lack of interest in activity or food.  Postpartum psychosis is a very serious condition and can be dangerous. Fortunately, it is rare. Displaying any of the following symptoms is cause for immediate medical attention. Symptoms of postpartum psychosis include:   Hallucinations and delusions.   Bizarre or disorganized behavior.   Confusion or disorientation.  DIAGNOSIS  A diagnosis is made by an evaluation of your symptoms. There are no medical or lab tests that lead to a diagnosis, but there are various questionnaires that a health care provider may use to identify those with the baby blues, postpartum depression, or psychosis. Often, a screening tool called the [place]  Postnatal Depression Scale is used to diagnose depression in the postpartum period.  TREATMENT The baby blues usually goes away on its own in 1-2 weeks. Social support is often all that is needed. You will be encouraged to get adequate sleep and rest. Occasionally, you may be given medicines to help you sleep.  Postpartum depression requires treatment because it can last several months or longer if it is not treated. Treatment may include individual or group therapy, medicine, or both to address any social, physiological, and psychological factors that may play a role in the depression. Regular exercise, a healthy diet, rest, and social support may also be strongly recommended.  Postpartum psychosis is more serious and needs treatment right away. Hospitalization is often needed. HOME CARE INSTRUCTIONS  Get as much rest as you can. Nap when the baby sleeps.   Exercise regularly. Some women find yoga and walking to be beneficial.   Eat a balanced and nourishing diet.   Do little things that you enjoy. Have a cup of tea, take a bubble bath, read your favorite magazine, or listen to your favorite music.  Avoid alcohol.   Ask for help with household chores, cooking, grocery  shopping, or running errands as needed. Do not try to do everything.   Talk to people close to you about how you are feeling. Get support from your partner, family members, friends, or other new moms.  Try to stay positive in how you think. Think about the things you are grateful for.   Do not spend a lot of time alone.   Only take over-the-counter or prescription medicine as directed by your health care provider.  Keep all your postpartum appointments.   Let your health care provider know if you have any concerns.  SEEK MEDICAL CARE IF: You are having a reaction to or problems with your medicine. SEEK IMMEDIATE MEDICAL CARE IF:  You have suicidal feelings.   You think you may harm the baby or someone  else. MAKE SURE YOU:  Understand these instructions.  Will watch your condition.  Will get help right away if you are not doing well or get worse.   This information is not intended to replace advice given to you by your health care provider. Make sure you discuss any questions you have with your health care provider.   Document Released: 10/26/2003 Document Revised: 01/26/2013 Document Reviewed: 11/02/2012 Elsevier Interactive Patient Education 2016 Elsevier Inc. Postpartum Care After Vaginal Delivery After you deliver your newborn (postpartum period), the usual stay in the hospital is 24-72 hours. If there were problems with your labor or delivery, or if you have other medical problems, you might be in the hospital longer.  While you are in the hospital, you will receive help and instructions on how to care for yourself and your newborn during the postpartum period.  While you are in the hospital:  Be sure to tell your nurses if you have pain or discomfort, as well as where you feel the pain and what makes the pain worse.  If you had an incision made near your vagina (episiotomy) or if you had some tearing during delivery, the nurses may put ice packs on your episiotomy or tear. The ice packs may help to reduce the pain and swelling.  If you are breastfeeding, you may feel uncomfortable contractions of your uterus for a couple of weeks. This is normal. The contractions help your uterus get back to normal size.  It is normal to have some bleeding after delivery.  For the first 1-3 days after delivery, the flow is red and the amount may be similar to a period.  It is common for the flow to start and stop.  In the first few days, you may pass some small clots. Let your nurses know if you begin to pass large clots or your flow increases.  Do not  flush blood clots down the toilet before having the nurse look at them.  During the next 3-10 days after delivery, your flow should become  more watery and pink or brown-tinged in color.  Ten to fourteen days after delivery, your flow should be a small amount of yellowish-white discharge.  The amount of your flow will decrease over the first few weeks after delivery. Your flow may stop in 6-8 weeks. Most women have had their flow stop by 12 weeks after delivery.  You should change your sanitary pads frequently.  Wash your hands thoroughly with soap and water for at least 20 seconds after changing pads, using the toilet, or before holding or feeding your newborn.  You should feel like you need to empty your bladder within the first 6-8 hours after delivery.  In case you become weak, lightheaded, or faint, call your nurse before you get out of bed for the first time and before you take a shower for the first time.  Within the first few days after delivery, your breasts may begin to feel tender and full. This is called engorgement. Breast tenderness usually goes away within 48-72 hours after engorgement occurs. You may also notice milk leaking from your breasts. If you are not breastfeeding, do not stimulate your breasts. Breast stimulation can make your breasts produce more milk.  Spending as much time as possible with your newborn is very important. During this time, you and your newborn can feel close and get to know each other. Having your newborn stay in your room (rooming in) will help to strengthen the bond with your newborn. It will give you time to get to know your newborn and become comfortable caring for your newborn.  Your hormones change after delivery. Sometimes the hormone changes can temporarily cause you to feel sad or tearful. These feelings should not last more than a few days. If these feelings last longer than that, you should talk to your caregiver.  If desired, talk to your caregiver about methods of family planning or contraception.  Talk to your caregiver about immunizations. Your caregiver may want you to  have the following immunizations before leaving the hospital:  Tetanus, diphtheria, and pertussis (Tdap) or tetanus and diphtheria (Td) immunization. It is very important that you and your family (including grandparents) or others caring for your newborn are up-to-date with the Tdap or Td immunizations. The Tdap or Td immunization can help protect your newborn from getting ill.  Rubella immunization.  Varicella (chickenpox) immunization.  Influenza immunization. You should receive this annual immunization if you did not receive the immunization during your pregnancy.   This information is not intended to replace advice given to you by your health care provider. Make sure you discuss any questions you have with your health care provider.   Document Released: 11/18/2006 Document Revised: 10/16/2011 Document Reviewed: 09/18/2011 Elsevier Interactive Patient Education [employer]2016 Elsevier Inc.

## 2015-11-24 NOTE — Lactation Note (Signed)
This note was copied from a baby's chart. Lactation Consultation Note  Patient Name: Rebekah [name] [id number] Date: 11/24/2015 Reason for consult: Follow-up assessment;Breast/nipple pain  Asked by RN to assess latch and feeding as parents would like to go home early.  Baby is 43 hrs old.  Mom reports that baby is feeding well.  Output - 6 meconium stools, 2 voids.  Baby has BF 7 times in last 24 hrs.  Mom's nipples pink and some blistering noted on tips.  Mom states the latches are painful.  Observed baby positioned in cradle hold, and Mom letting baby latch onto nipple.  Offered assistance with using a cross cradle hold, and showed Mom how to support and sandwich breast when baby opens her mouth widely.  Baby had just fed for 25 minutes per parents so gave a few sucks and then fell asleep.  Parents were under the understanding they shouldn't burp baby since they are breastfeeding.  Encouraged them to sit baby up and burp between breasts, and at end of feedings.  Baby burped loudly twice while LC demonstrating.  Recommended they keep baby skin to skin and call out when baby begins cueing to feed.  30 minutes later, parents called in saying baby was ready to feed.  They had changed baby's diaper and she was crying in FOB's arms.  Offered assistance and instruction on football hold.  Mom had started crying as she is worried she doesn't know what she is doing.  Reassurance and TLC given about about well she was doing.  Talked about the benefit of she and baby staying one more night for support with latches and positioning. Baby positioned in football hold on right breast.  Mom's shoulders hunched, and arm stiff.  Spent time helping her get more relaxed, deep breathing etc. Assisted with manual expression, and colostrum easily expressed.  Baby latch deeply and quickly.  Showed FOB how to assess the lower lip, and how to gently tug on chin to uncurl lower lip.  Mom stated pain level 1.  Regular swallowing  identified to parents.  Encouraged Mom to use alternate breast compression during sucking to increase milk transfer.  Basics reviewed, lots of questions answered.  Expressed breast milk to be dabbed on nipple post feedings.  Mom has a DEBP at home.  Encouraged STS and cue based feedings with goal of >8 feedings in 24 hrs.  Mom knows to pump if breasts become full and uncomfortable and baby doesn't empty her breasts.  Engorgement prevention and treatment discussed.  Parents to talk about whether they still want to go home.  Mom tear eyed, and Dad wanting to go home as he is uncomfortable sleeping in hospital.  Information relayed to RN.  OP lactation services information given.   Maternal Data    Feeding Feeding Type: Breast Fed Length of feed: 25 min  LATCH Score/Interventions Latch: Grasps breast easily, tongue down, lips flanged, rhythmical sucking. (after a couple attempt to latch) Intervention(s): Skin to skin;Teach feeding cues;Waking techniques Intervention(s): Breast compression;Breast massage;Assist with latch;Adjust position  Audible Swallowing: Spontaneous and intermittent Intervention(s): Hand expression;Skin to skin  Type of Nipple: Everted at rest and after stimulation  Comfort (Breast/Nipple): Filling, red/small blisters or bruises, mild/mod discomfort  Problem noted: Mild/Moderate discomfort;Cracked, bleeding, blisters, bruises (nipple tip slightly blistered on both) Interventions (Mild/moderate discomfort): Hand massage;Hand expression  Hold (Positioning): Assistance needed to correctly position infant at breast and maintain latch. Intervention(s): Breastfeeding basics reviewed;Support Pillows;Position options;Skin to skin  LATCH Score:  8  Lactation Tools Discussed/Used     Consult Status Consult Status: Complete Date: 11/24/15 Follow-up type: Call as needed    Rebekah [name], Rebekah [name] E 11/24/2015, 5:11 PM

## 2015-11-26 LAB — TYPE AND SCREEN
ABO/RH(D): B NEG
Antibody Screen: POSITIVE
DAT, IGG: NEGATIVE
UNIT DIVISION: 0
UNIT DIVISION: 0

## 2017-02-04 NOTE — L&D Delivery Note (Addendum)
Patient: Rebekah [name] MRN: [id number]  GBS status: Neg, IAP given no  Patient is a 27 y.o. now G2P2002 s/p NSVD at [redacted]w[redacted]d, who was admitted for PROM. PROM 3h 60m prior to delivery with clear fluid.    Delivery Note At 7:33 PM a viable female was delivered via Vaginal, Spontaneous (Presentation: vertex).  APGAR: 8,9; weight pending.  Placenta status: intact.  Cord: 3 vessel with the following complications: none  Anesthesia: Epidural Episiotomy: None Lacerations: None Suture Repair: NA Est. Blood Loss (mL): 51  Head delivered OA, no nuchal. Shoulder and body delivered in usual fashion. Infant with spontaneous cry, placed on mother's abdomen, dried and bulb suctioned. Cord clamped x 2 after 1-minute delay, and cut by family member. Cord blood drawn. Placenta delivered spontaneously with gentle cord traction. Fundus firm with massage and Pitocin. Perineum inspected and found to have no laceration.  Mom to postpartum.  Baby to Couplet care / Skin to Skin.  JACOB E PERRIN 11/30/2017, 8:01 PM  OB FELLOW DELIVERY ATTESTATION  I was gloved and present for the delivery in its entirety, and I agree with the above resident's note.    Marcy Siren, D.O. OB Fellow  12/01/2017, 4:27 PM

## 2017-03-17 ENCOUNTER — Other Ambulatory Visit: Payer: Self-pay

## 2017-03-17 ENCOUNTER — Emergency Department (HOSPITAL_COMMUNITY)
Admission: EM | Admit: 2017-03-17 | Discharge: 2017-03-17 | Disposition: A | Discharge status: Home / Self Care | Payer: BLUE CROSS/BLUE SHIELD | Attending: Emergency Medicine | Admitting: Emergency Medicine

## 2017-03-17 ENCOUNTER — Encounter (HOSPITAL_COMMUNITY): Payer: Self-pay

## 2017-03-17 DIAGNOSIS — Z79899 Other long term (current) drug therapy: Secondary | ICD-10-CM | POA: Insufficient documentation

## 2017-03-17 DIAGNOSIS — N750 Cyst of Bartholin's gland: Secondary | ICD-10-CM | POA: Insufficient documentation

## 2017-03-17 DIAGNOSIS — F329 Major depressive disorder, single episode, unspecified: Secondary | ICD-10-CM | POA: Insufficient documentation

## 2017-03-17 MED ORDER — LIDOCAINE HCL (PF) 2 % IJ SOLN
INTRAMUSCULAR | Status: AC
  Administered 2017-03-17: 5 mL via INTRADERMAL
  Filled 2017-03-17: qty 20

## 2017-03-17 MED ORDER — IBUPROFEN 600 MG PO TABS: 600.0000 mg | ORAL_TABLET | Freq: Four times a day (QID) | ORAL | 0 refills | Status: DC | PRN

## 2017-03-17 MED ORDER — LIDOCAINE HCL (PF) 2 % IJ SOLN
10.0000 mL | Freq: Once | INTRAMUSCULAR | Status: AC
  Administered 2017-03-17: 5 mL via INTRADERMAL

## 2017-03-17 NOTE — Discharge Instructions (Signed)
Warm water soaks.  Call the OB/GYN office listed to arrange a follow-up appt.

## 2017-03-17 NOTE — ED Triage Notes (Signed)
Patient reports of increased swelling/pain to left labia x 4 days. States she has previous issues with cyst in the same place and had a drain placed approx. 5 months ago and never went to get it removed. Patient requesting to get drain removed.

## 2017-03-17 NOTE — ED Provider Notes (Signed)
[facility] EMERGENCY DEPARTMENT Provider Note   CSN: [id number] Arrival date & time: 03/17/17  4098     History   Chief Complaint Chief Complaint  Patient presents with  . Abscess    HPI Rebekah [name] is a 27 y.o. female.  HPI   Rebekah [name] is a 27 y.o. female who presents to the Emergency Department complaining of discomfort and swelling of the left lower labia.  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  She states this area was last drained in June of last year by her previous gynecologist.  She reports increased swelling of the same area 4-5 days ago.  She states that a drain was placed at that time, but states that she never had it removed.  She is sexually active.  She denies vaginal discharge, abdominal pain or pelvic pain, dysuria, abnormal menses, fever, and chills.  She does not currently use birth control   Past Medical History:  Diagnosis Date  . Bartholin's cyst   . Chlamydia   . Depression   . Eczema   . Headache     Patient Active Problem List   Diagnosis Date Noted  . Second-degree perineal laceration, with delivery 11/24/2015  . Vacuum extraction, delivered, current hospitalization 11/23/2015  . Obstetrical laceration 11/23/2015  . Labor and delivery indication for care or intervention 11/22/2015  . Social anxiety disorder 03/25/2013  . Depression 03/25/2013    Past Surgical History:  Procedure Laterality Date  . WISDOM TOOTH EXTRACTION      OB History    Gravida Para Term Preterm AB Living   1 1 1  0 0 1   SAB TAB Ectopic Multiple Live Births   0 0 0 0 1       Home Medications    Prior to Admission medications   Medication Sig Start Date End Date Taking? Authorizing Provider  ibuprofen (ADVIL,MOTRIN) 600 MG tablet Take 1 tablet (600 mg total) by mouth every 6 (six) hours. 11/25/15  Yes Gerrit Heck, CNM  norethindrone (MICRONOR,CAMILA,ERRIN) 0.35 MG tablet Take 1 tablet by mouth daily. 06/20/16 06/20/17 Yes [provider]  Prenatal Vit-Fe Fumarate-FA (PRENATAL MULTIVITAMIN) TABS tablet Take 1 tablet by mouth daily at 12 noon.   Yes [provider]    Family History Family History  Problem Relation Age of Onset  . Cancer Mother     Social History Social History   Tobacco Use  . Smoking status: Never Smoker  . Smokeless tobacco: Never Used  Substance Use Topics  . Alcohol use: No  . Drug use: No     Allergies   Patient has no known allergies.   Review of Systems Review of Systems  Constitutional: Negative for chills and fever.  Gastrointestinal: Negative for abdominal pain, nausea and vomiting.  Genitourinary: Positive for vaginal pain. Negative for dyspareunia, dysuria, vaginal bleeding and vaginal discharge.       Bartholin cyst  Musculoskeletal: Negative for arthralgias and joint swelling.  Skin: Negative for color change.  Hematological: Negative for adenopathy.  All other systems reviewed and are negative.    Physical Exam Updated Vital Signs BP (!) 145/82 (BP Location: Right Arm)   Pulse 82   Temp 97.6 F (36.4 C) (Oral)   Resp 16   Ht 5\' 3"  (1.6 m)   Wt 45.4 kg (100 lb)   LMP 03/10/2017   SpO2 99%   BMI 17.71 kg/m   Physical Exam  Constitutional: She is oriented to person, place, and time.  She appears well-developed and well-nourished. No distress.  HENT:  Head: Atraumatic.  Cardiovascular: Normal rate, regular rhythm and normal heart sounds.  No murmur heard. Pulmonary/Chest: Effort normal and breath sounds normal. No respiratory distress.  Abdominal: Soft. She exhibits no distension. There is no tenderness. There is no guarding.  Genitourinary:  Genitourinary Comments: Normal appearing external genitalia.  No catheter or other drain, packing material visible. 4 cm left Bartholin's cyst.  No surrounding erythema  Musculoskeletal: Normal range of motion.  Neurological: She is alert and oriented to person, place, and time. She exhibits normal  muscle tone. Coordination normal.  Skin: Skin is warm and dry. No erythema.  Psychiatric: She has a normal mood and affect.  Nursing note and vitals reviewed.    ED Treatments / Results  Labs (all labs ordered are listed, but only abnormal results are displayed) Labs Reviewed - No data to display  EKG  EKG Interpretation None       Radiology No results found.  Procedures Procedures (including critical care time)    INCISION AND DRAINAGE Performed by: Maxwell CaulRIPLETT,Robie Oats L. Consent: Verbal consent obtained. Risks and benefits: risks, benefits and alternatives were discussed Type: abscess  Body area: left labia  Anesthesia: local infiltration  Incision was made with a #11 scalpel.  Local anesthetic: lidocaine 2 % w/o epinephrine  Anesthetic total: 3 ml  Complexity: complex Blunt dissection to break up loculations  Drainage: Clear mucous  Drainage amount: Large  Packing material: none.  Patient refused Word catheter  Patient tolerance: Patient tolerated the procedure well with no immediate complications.    Medications Ordered in ED Medications  lidocaine (XYLOCAINE) 2 % injection 10 mL (5 mLs Intradermal Given by Other 03/17/17 1059)     Initial Impression / Assessment and Plan / ED Course  I have reviewed the triage vital signs and the nursing notes.  Pertinent labs & imaging results that were available during my care of the patient were reviewed by me and considered in my medical decision making (see chart for details).     Pt with recurrent Bartholin's cyst.  Refused Word catheter.  Aspirate was clear, no purulence.  No surrounding erythema.  Patient requesting GYN referral will give information for [facility].  No abdominal or pelvic pain.  Symptoms improved after I&D.  Patient appears safe for discharge home.  Final Clinical Impressions(s) / ED Diagnoses   Final diagnoses:  Bartholin cyst    ED Discharge Orders    None       Pauline Ausriplett,  Juanluis Guastella, PA-C 03/17/17 1129    Samuel JesterMcManus, Kathleen, DO 03/19/17 [id number]

## 2017-04-29 ENCOUNTER — Telehealth: Payer: Self-pay | Admitting: *Deleted

## 2017-04-29 NOTE — Telephone Encounter (Signed)
Spoke with pt letting her know cramping and spotting can be normal in early pregnancy. Pt hasn't noticed any spotting just cramping. Has appt scheduled  April 8. Advised to call sooner if starts bleeding or any other symptoms. Pt voiced understanding. JSY

## 2017-05-13 ENCOUNTER — Encounter: Payer: Self-pay | Admitting: Adult Health

## 2017-05-13 ENCOUNTER — Ambulatory Visit: Payer: Medicaid Other | Admitting: Adult Health

## 2017-05-13 VITALS — BP 118/70 | HR 84 | Ht 63.0 in | Wt 104.0 lb

## 2017-05-13 DIAGNOSIS — O3680X0 Pregnancy with inconclusive fetal viability, not applicable or unspecified: Secondary | ICD-10-CM

## 2017-05-13 DIAGNOSIS — N926 Irregular menstruation, unspecified: Secondary | ICD-10-CM

## 2017-05-13 DIAGNOSIS — Z3490 Encounter for supervision of normal pregnancy, unspecified, unspecified trimester: Secondary | ICD-10-CM | POA: Insufficient documentation

## 2017-05-13 DIAGNOSIS — Z3201 Encounter for pregnancy test, result positive: Secondary | ICD-10-CM | POA: Insufficient documentation

## 2017-05-13 LAB — POCT URINE PREGNANCY: PREG TEST UR: POSITIVE — AB

## 2017-05-13 MED ORDER — FLINTSTONES COMPLETE 60 MG PO CHEW: CHEWABLE_TABLET | ORAL | Status: DC

## 2017-05-13 NOTE — Progress Notes (Signed)
Subjective:     Patient ID: Rebekah [name], female   DOB: [birth year], 27 y.o.   MRN: [id number]  HPI Rebekah [name] is a 27 year old white female in for UPT, has missed several periods, ahs 5818 month old at home.   Review of Systems +missed periods ?LMP in January  Reviewed past medical,surgical, social and family history. Reviewed medications and allergies.     Objective:   Physical Exam BP 118/70 (BP Location: Left Arm, Patient Position: Sitting, Cuff Size: Normal)   Pulse 84   Ht 5\' 3"  (1.6 m)   Wt 104 lb (47.2 kg)   LMP 02/18/2017 (Approximate)   Breastfeeding? No   BMI 18.42 kg/m pt thinks had period in January, so about 11+6 weeks and EDD 11/25/17 by guessed LMP.Skin warm and dry. Neck: mid line trachea, normal thyroid, good ROM, no lymphadenopathy noted. Lungs: clear to ausculation bilaterally. Cardiovascular: regular rate and rhythm.Abdomen is soft and non tender.PHQ 2 score 0.FHR 160 via doppler. [facility] and about after hours call service.    Assessment:     1. Pregnancy test positive   2. Pregnancy, unspecified gestational age   873. Encounter to determine fetal viability of pregnancy, single or unspecified fetus       Plan:     Meds ordered this encounter  Medications  . flintstones complete (FLINTSTONES) 60 MG chewable tablet    Sig: Take 2 daily    Order Specific Question:   Supervising Provider    Answer:   Lazaro ArmsEURE, LUTHER H [[id number]]  Return in 1 day for dating US Review handouts on First trimester and by [facility]

## 2017-05-13 NOTE — Patient Instructions (Signed)
First Trimester of Pregnancy The first trimester of pregnancy is from week 1 until the end of week 13 (months 1 through 3). A week after a sperm fertilizes an egg, the egg will implant on the wall of the uterus. This embryo will begin to develop into a baby. Genes from you and your partner will form the baby. The female genes will determine whether the baby will be a boy or a girl. At 6-8 weeks, the eyes and face will be formed, and the heartbeat can be seen on ultrasound. At the end of 12 weeks, all the baby's organs will be formed. Now that you are pregnant, you will want to do everything you can to have a healthy baby. Two of the most important things are to get good prenatal care and to follow your health care provider's instructions. Prenatal care is all the medical care you receive before the baby's birth. This care will help prevent, find, and treat any problems during the pregnancy and childbirth. Body changes during your first trimester Your body goes through many changes during pregnancy. The changes vary from woman to woman.  You may gain or lose a couple of pounds at first.  You may feel sick to your stomach (nauseous) and you may throw up (vomit). If the vomiting is uncontrollable, call your health care provider.  You may tire easily.  You may develop headaches that can be relieved by medicines. All medicines should be approved by your health care provider.  You may urinate more often. Painful urination may mean you have a bladder infection.  You may develop heartburn as a result of your pregnancy.  You may develop constipation because certain hormones are causing the muscles that push stool through your intestines to slow down.  You may develop hemorrhoids or swollen veins (varicose veins).  Your breasts may begin to grow larger and become tender. Your nipples may stick out more, and the tissue that surrounds them (areola) may become darker.  Your gums may bleed and may be  sensitive to brushing and flossing.  Dark spots or blotches (chloasma, mask of pregnancy) may develop on your face. This will likely fade after the baby is born.  Your menstrual periods will stop.  You may have a loss of appetite.  You may develop cravings for certain kinds of food.  You may have changes in your emotions from day to day, such as being excited to be pregnant or being concerned that something may go wrong with the pregnancy and baby.  You may have more vivid and strange dreams.  You may have changes in your hair. These can include thickening of your hair, rapid growth, and changes in texture. Some women also have hair loss during or after pregnancy, or hair that feels dry or thin. Your hair will most likely return to normal after your baby is born.  What to expect at prenatal visits During a routine prenatal visit:  You will be weighed to make sure you and the baby are growing normally.  Your blood pressure will be taken.  Your abdomen will be measured to track your baby's growth.  The fetal heartbeat will be listened to between weeks 10 and 14 of your pregnancy.  Test results from any previous visits will be discussed.  Your health care provider may ask you:  How you are feeling.  If you are feeling the baby move.  If you have had any abnormal symptoms, such as leaking fluid, bleeding, severe headaches,   or abdominal cramping.  If you are using any tobacco products, including cigarettes, chewing tobacco, and electronic cigarettes.  If you have any questions.  Other tests that may be performed during your first trimester include:  Blood tests to find your blood type and to check for the presence of any previous infections. The tests will also be used to check for low iron levels (anemia) and protein on red blood cells (Rh antibodies). Depending on your risk factors, or if you previously had diabetes during pregnancy, you may have tests to check for high blood  sugar that affects pregnant women (gestational diabetes).  Urine tests to check for infections, diabetes, or protein in the urine.  An ultrasound to confirm the proper growth and development of the baby.  Fetal screens for spinal cord problems (spina bifida) and Down syndrome.  HIV (human immunodeficiency virus) testing. Routine prenatal testing includes screening for HIV, unless you choose not to have this test.  You may need other tests to make sure you and the baby are doing well.  Follow these instructions at home: Medicines  Follow your health care provider's instructions regarding medicine use. Specific medicines may be either safe or unsafe to take during pregnancy.  Take a prenatal vitamin that contains at least 600 micrograms (mcg) of folic acid.  If you develop constipation, try taking a stool softener if your health care provider approves. Eating and drinking  Eat a balanced diet that includes fresh fruits and vegetables, whole grains, good sources of protein such as meat, eggs, or tofu, and low-fat dairy. Your health care provider will help you determine the amount of weight gain that is right for you.  Avoid raw meat and uncooked cheese. These carry germs that can cause birth defects in the baby.  Eating four or five small meals rather than three large meals a day may help relieve nausea and vomiting. If you start to feel nauseous, eating a few soda crackers can be helpful. Drinking liquids between meals, instead of during meals, also seems to help ease nausea and vomiting.  Limit foods that are high in fat and processed sugars, such as fried and sweet foods.  To prevent constipation: ? Eat foods that are high in fiber, such as fresh fruits and vegetables, whole grains, and beans. ? Drink enough fluid to keep your urine clear or pale yellow. Activity  Exercise only as directed by your health care provider. Most women can continue their usual exercise routine during  pregnancy. Try to exercise for 30 minutes at least 5 days a week. Exercising will help you: ? Control your weight. ? Stay in shape. ? Be prepared for labor and delivery.  Experiencing pain or cramping in the lower abdomen or lower back is a good sign that you should stop exercising. Check with your health care provider before continuing with normal exercises.  Try to avoid standing for long periods of time. Move your legs often if you must stand in one place for a long time.  Avoid heavy lifting.  Wear low-heeled shoes and practice good posture.  You may continue to have sex unless your health care provider tells you not to. Relieving pain and discomfort  Wear a good support bra to relieve breast tenderness.  Take warm sitz baths to soothe any pain or discomfort caused by hemorrhoids. Use hemorrhoid cream if your health care provider approves.  Rest with your legs elevated if you have leg cramps or low back pain.  If you develop   varicose veins in your legs, wear support hose. Elevate your feet for 15 minutes, 3-4 times a day. Limit salt in your diet. Prenatal care  Schedule your prenatal visits by the twelfth week of pregnancy. They are usually scheduled monthly at first, then more often in the last 2 months before delivery.  Write down your questions. Take them to your prenatal visits.  Keep all your prenatal visits as told by your health care provider. This is important. Safety  Wear your seat belt at all times when driving.  Make a list of emergency phone numbers, including numbers for family, friends, the hospital, and police and fire departments. General instructions  Ask your health care provider for a referral to a local prenatal education class. Begin classes no later than the beginning of month 6 of your pregnancy.  Ask for help if you have counseling or nutritional needs during pregnancy. Your health care provider can offer advice or refer you to specialists for help  with various needs.  Do not use hot tubs, steam rooms, or saunas.  Do not douche or use tampons or scented sanitary pads.  Do not cross your legs for long periods of time.  Avoid cat litter boxes and soil used by cats. These carry germs that can cause birth defects in the baby and possibly loss of the fetus by miscarriage or stillbirth.  Avoid all smoking, herbs, alcohol, and medicines not prescribed by your health care provider. Chemicals in these products affect the formation and growth of the baby.  Do not use any products that contain nicotine or tobacco, such as cigarettes and e-cigarettes. If you need help quitting, ask your health care provider. You may receive counseling support and other resources to help you quit.  Schedule a dentist appointment. At home, brush your teeth with a soft toothbrush and be gentle when you floss. Contact a health care provider if:  You have dizziness.  You have mild pelvic cramps, pelvic pressure, or nagging pain in the abdominal area.  You have persistent nausea, vomiting, or diarrhea.  You have a bad smelling vaginal discharge.  You have pain when you urinate.  You notice increased swelling in your face, hands, legs, or ankles.  You are exposed to fifth disease or chickenpox.  You are exposed to German measles (rubella) and have never had it. Get help right away if:  You have a fever.  You are leaking fluid from your vagina.  You have spotting or bleeding from your vagina.  You have severe abdominal cramping or pain.  You have rapid weight gain or loss.  You vomit blood or material that looks like coffee grounds.  You develop a severe headache.  You have shortness of breath.  You have any kind of trauma, such as from a fall or a car accident. Summary  The first trimester of pregnancy is from week 1 until the end of week 13 (months 1 through 3).  Your body goes through many changes during pregnancy. The changes vary from  woman to woman.  You will have routine prenatal visits. During those visits, your health care provider will examine you, discuss any test results you may have, and talk with you about how you are feeling. This information is not intended to replace advice given to you by your health care provider. Make sure you discuss any questions you have with your health care provider. Document Released: 01/15/2001 Document Revised: 01/03/2016 Document Reviewed: 01/03/2016 Elsevier Interactive Patient Education  2018 Elsevier   Inc.  

## 2017-05-14 ENCOUNTER — Ambulatory Visit (INDEPENDENT_AMBULATORY_CARE_PROVIDER_SITE_OTHER): Payer: Medicaid Other

## 2017-05-14 DIAGNOSIS — O3680X0 Pregnancy with inconclusive fetal viability, not applicable or unspecified: Secondary | ICD-10-CM

## 2017-05-14 DIAGNOSIS — Z3A11 11 weeks gestation of pregnancy: Secondary | ICD-10-CM

## 2017-05-14 NOTE — Progress Notes (Signed)
US 11 wks single IUP w/ys,positive fht 158 bpm,normal ovaries bilat,anterior pl gr 0,crl 41.76 mm,EDD 12/03/2017 by [place]

## 2017-05-27 ENCOUNTER — Other Ambulatory Visit: Payer: Self-pay | Admitting: Obstetrics & Gynecology

## 2017-05-27 DIAGNOSIS — Z3682 Encounter for antenatal screening for nuchal translucency: Secondary | ICD-10-CM

## 2017-05-28 ENCOUNTER — Ambulatory Visit (INDEPENDENT_AMBULATORY_CARE_PROVIDER_SITE_OTHER): Payer: Medicaid Other

## 2017-05-28 ENCOUNTER — Encounter: Payer: Self-pay | Admitting: Advanced Practice Midwife

## 2017-05-28 ENCOUNTER — Ambulatory Visit: Payer: Medicaid Other | Admitting: *Deleted

## 2017-05-28 ENCOUNTER — Other Ambulatory Visit (HOSPITAL_COMMUNITY)
Admission: RE | Admit: 2017-05-28 | Discharge: 2017-05-28 | Disposition: A | Discharge status: Home / Self Care | Payer: Medicaid Other | Source: Ambulatory Visit | Attending: Advanced Practice Midwife | Admitting: Advanced Practice Midwife

## 2017-05-28 ENCOUNTER — Ambulatory Visit (INDEPENDENT_AMBULATORY_CARE_PROVIDER_SITE_OTHER): Payer: Medicaid Other | Admitting: Advanced Practice Midwife

## 2017-05-28 VITALS — BP 118/70 | HR 86 | Wt 101.0 lb

## 2017-05-28 DIAGNOSIS — Z124 Encounter for screening for malignant neoplasm of cervix: Secondary | ICD-10-CM

## 2017-05-28 DIAGNOSIS — Z3682 Encounter for antenatal screening for nuchal translucency: Secondary | ICD-10-CM | POA: Diagnosis not present

## 2017-05-28 DIAGNOSIS — Z3A13 13 weeks gestation of pregnancy: Secondary | ICD-10-CM

## 2017-05-28 DIAGNOSIS — N750 Cyst of Bartholin's gland: Secondary | ICD-10-CM

## 2017-05-28 DIAGNOSIS — Z3482 Encounter for supervision of other normal pregnancy, second trimester: Secondary | ICD-10-CM

## 2017-05-28 DIAGNOSIS — Z1389 Encounter for screening for other disorder: Secondary | ICD-10-CM

## 2017-05-28 DIAGNOSIS — O9989 Other specified diseases and conditions complicating pregnancy, childbirth and the puerperium: Secondary | ICD-10-CM | POA: Diagnosis not present

## 2017-05-28 DIAGNOSIS — Z3490 Encounter for supervision of normal pregnancy, unspecified, unspecified trimester: Secondary | ICD-10-CM | POA: Insufficient documentation

## 2017-05-28 DIAGNOSIS — Z331 Pregnant state, incidental: Secondary | ICD-10-CM

## 2017-05-28 DIAGNOSIS — L309 Dermatitis, unspecified: Secondary | ICD-10-CM | POA: Insufficient documentation

## 2017-05-28 LAB — POCT URINALYSIS DIPSTICK
Blood, UA: NEGATIVE
Glucose, UA: NEGATIVE
LEUKOCYTES UA: NEGATIVE
Nitrite, UA: NEGATIVE
Protein, UA: NEGATIVE

## 2017-05-28 MED ORDER — ONDANSETRON 4 MG PO TBDP: 4.0000 mg | ORAL_TABLET | Freq: Four times a day (QID) | ORAL | 1 refills | Status: DC | PRN

## 2017-05-28 NOTE — Progress Notes (Signed)
US 13 wks,measurements c/w dates,NB present,NT 1.3 mm,normal ovaries bilat,anterior placenta gr 0,CRL 67.03 mm

## 2017-05-28 NOTE — Progress Notes (Signed)
Subjective:    Rebekah [name] is a G2P1001 [redacted]w[redacted]d being seen today for her first obstetrical visit.  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  Pregnancy history fully reviewed. Has a bartholin cyst that has been recurrent. Feels like it may be popping up again. Feels it, doesn't hurt. Had it drained in Feb, only "clear drainage".  Lasted about 3 weeks, now the same size. Has had a word catheter that lasted a month.  Pap last year was ASCUS w/HRHPV . Will repeat today. Hx depression, feels well now, declined therapy referral Patient reports nausea.  Vitals:   05/28/17 1042  BP: 118/70  Pulse: 86  Weight: 101 lb (45.8 kg)    HISTORY: OB History  Gravida Para Term Preterm AB Living  2 1 1  0 0 1  SAB TAB Ectopic Multiple Live Births  0 0 0 0 1    # Outcome Date GA Lbr Len/2nd Weight Sex Delivery Anes PTL Lv  2 Current           1 Term 11/23/15 [redacted]w[redacted]d 16:40 / 04:42 7 lb 3.2 oz (3.265 kg) F Vag-Vacuum EPI N LIV   Past Medical History:  Diagnosis Date  . Bartholin's cyst   . Chlamydia   . Depression   . Eczema   . Headache   . Vaginal Pap smear, abnormal    Past Surgical History:  Procedure Laterality Date  . WISDOM TOOTH EXTRACTION     Family History  Problem Relation Age of Onset  . Cancer Mother        lung     Exam       Pelvic Exam:    Perineum: normal   Vulva: Left bartholin gland cyst, about 5X3cms.  Somewhat tender     Vagina:  normal mucosa, normal discharge, no palpable nodules   Uterus Normal, Gravid, FH: 13     Cervix: normal   Adnexa: Not palpable   Urinary:  urethral meatus normal    System:     Skin: normal coloration and turgor, no rashes    Neurologic: oriented, normal, normal mood   Extremities: normal strength, tone, and muscle mass   HEENT PERRLA   Mouth/Teeth mucous membranes moist, normal dentition   Neck supple and no masses   Cardiovascular: regular rate and rhythm   Respiratory:  appears well, vitals normal,  no respiratory distress, acyanotic   Abdomen: soft, non-tender;  FHR: 154       [place] 13 wks,measurements c/w dates,NB present,NT 1.3 mm,normal ovaries bilat,anterior placenta gr 0,CRL 67.03 mm   The nature of [facility] with multiple MDs and other Advanced Practice Providers was explained to patient; also emphasized that residents, students are part of our team.  Assessment:    Pregnancy: G2P1001 Patient Active Problem List   Diagnosis Date Noted  . Supervision of normal pregnancy 05/28/2017  . Obstetrical laceration 11/23/2015  . Social anxiety disorder 03/25/2013  . Depression 03/25/2013        Plan:     Initial labs drawn. Continue prenatal vitamins  rx zofran prn Problem list reviewed and updated  Reviewed n/v relief measures and warning s/s to report  Reviewed recommended weight gain based on pre-gravid BMI  Encouraged well-balanced diet Genetic Screening discussed Integrated Screen: requested.  Ultrasound discussed; fetal survey: requested.  Return for ASAP for bartholin cyst drainage w/MD and 4 weeks for LROB.  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

## 2017-05-28 NOTE — Patient Instructions (Signed)
 First Trimester of Pregnancy The first trimester of pregnancy is from week 1 until the end of week 12 (months 1 through 3). A week after a sperm fertilizes an egg, the egg will implant on the wall of the uterus. This embryo will begin to develop into a baby. Genes from you and your partner are forming the baby. The female genes determine whether the baby is a boy or a girl. At 6-8 weeks, the eyes and face are formed, and the heartbeat can be seen on ultrasound. At the end of 12 weeks, all the baby's organs are formed.  Now that you are pregnant, you will want to do everything you can to have a healthy baby. Two of the most important things are to get good prenatal care and to follow your health care provider's instructions. Prenatal care is all the medical care you receive before the baby's birth. This care will help prevent, find, and treat any problems during the pregnancy and childbirth. BODY CHANGES Your body goes through many changes during pregnancy. The changes vary from woman to woman.   You may gain or lose a couple of pounds at first.  You may feel sick to your stomach (nauseous) and throw up (vomit). If the vomiting is uncontrollable, call your health care provider.  You may tire easily.  You may develop headaches that can be relieved by medicines approved by your health care provider.  You may urinate more often. Painful urination may mean you have a bladder infection.  You may develop heartburn as a result of your pregnancy.  You may develop constipation because certain hormones are causing the muscles that push waste through your intestines to slow down.  You may develop hemorrhoids or swollen, bulging veins (varicose veins).  Your breasts may begin to grow larger and become tender. Your nipples may stick out more, and the tissue that surrounds them (areola) may become darker.  Your gums may bleed and may be sensitive to brushing and flossing.  Dark spots or blotches  (chloasma, mask of pregnancy) may develop on your face. This will likely fade after the baby is born.  Your menstrual periods will stop.  You may have a loss of appetite.  You may develop cravings for certain kinds of food.  You may have changes in your emotions from day to day, such as being excited to be pregnant or being concerned that something may go wrong with the pregnancy and baby.  You may have more vivid and strange dreams.  You may have changes in your hair. These can include thickening of your hair, rapid growth, and changes in texture. Some women also have hair loss during or after pregnancy, or hair that feels dry or thin. Your hair will most likely return to normal after your baby is born. WHAT TO EXPECT AT YOUR PRENATAL VISITS During a routine prenatal visit:  You will be weighed to make sure you and the baby are growing normally.  Your blood pressure will be taken.  Your abdomen will be measured to track your baby's growth.  The fetal heartbeat will be listened to starting around week 10 or 12 of your pregnancy.  Test results from any previous visits will be discussed. Your health care provider may ask you:  How you are feeling.  If you are feeling the baby move.  If you have had any abnormal symptoms, such as leaking fluid, bleeding, severe headaches, or abdominal cramping.  If you have any questions. Other   tests that may be performed during your first trimester include:  Blood tests to find your blood type and to check for the presence of any previous infections. They will also be used to check for low iron levels (anemia) and Rh antibodies. Later in the pregnancy, blood tests for diabetes will be done along with other tests if problems develop.  Urine tests to check for infections, diabetes, or protein in the urine.  An ultrasound to confirm the proper growth and development of the baby.  An amniocentesis to check for possible genetic problems.  Fetal  screens for spina bifida and Down syndrome.  You may need other tests to make sure you and the baby are doing well. HOME CARE INSTRUCTIONS  Medicines  Follow your health care provider's instructions regarding medicine use. Specific medicines may be either safe or unsafe to take during pregnancy.  Take your prenatal vitamins as directed.  If you develop constipation, try taking a stool softener if your health care provider approves. Diet  Eat regular, well-balanced meals. Choose a variety of foods, such as meat or vegetable-based protein, fish, milk and low-fat dairy products, vegetables, fruits, and whole grain breads and cereals. Your health care provider will help you determine the amount of weight gain that is right for you.  Avoid raw meat and uncooked cheese. These carry germs that can cause birth defects in the baby.  Eating four or five small meals rather than three large meals a day may help relieve nausea and vomiting. If you start to feel nauseous, eating a few soda crackers can be helpful. Drinking liquids between meals instead of during meals also seems to help nausea and vomiting.  If you develop constipation, eat more high-fiber foods, such as fresh vegetables or fruit and whole grains. Drink enough fluids to keep your urine clear or pale yellow. Activity and Exercise  Exercise only as directed by your health care provider. Exercising will help you:  Control your weight.  Stay in shape.  Be prepared for labor and delivery.  Experiencing pain or cramping in the lower abdomen or low back is a good sign that you should stop exercising. Check with your health care provider before continuing normal exercises.  Try to avoid standing for long periods of time. Move your legs often if you must stand in one place for a long time.  Avoid heavy lifting.  Wear low-heeled shoes, and practice good posture.  You may continue to have sex unless your health care provider directs you  otherwise. Relief of Pain or Discomfort  Wear a good support bra for breast tenderness.   Take warm sitz baths to soothe any pain or discomfort caused by hemorrhoids. Use hemorrhoid cream if your health care provider approves.   Rest with your legs elevated if you have leg cramps or low back pain.  If you develop varicose veins in your legs, wear support hose. Elevate your feet for 15 minutes, 3-4 times a day. Limit salt in your diet. Prenatal Care  Schedule your prenatal visits by the twelfth week of pregnancy. They are usually scheduled monthly at first, then more often in the last 2 months before delivery.  Write down your questions. Take them to your prenatal visits.  Keep all your prenatal visits as directed by your health care provider. Safety  Wear your seat belt at all times when driving.  Make a list of emergency phone numbers, including numbers for family, friends, the hospital, and police and fire departments. General   Tips  Ask your health care provider for a referral to a local prenatal education class. Begin classes no later than at the beginning of month 6 of your pregnancy.  Ask for help if you have counseling or nutritional needs during pregnancy. Your health care provider can offer advice or refer you to specialists for help with various needs.  Do not use hot tubs, steam rooms, or saunas.  Do not douche or use tampons or scented sanitary pads.  Do not cross your legs for long periods of time.  Avoid cat litter boxes and soil used by cats. These carry germs that can cause birth defects in the baby and possibly loss of the fetus by miscarriage or stillbirth.  Avoid all smoking, herbs, alcohol, and medicines not prescribed by your health care provider. Chemicals in these affect the formation and growth of the baby.  Schedule a dentist appointment. At home, brush your teeth with a soft toothbrush and be gentle when you floss. SEEK MEDICAL CARE IF:   You have  dizziness.  You have mild pelvic cramps, pelvic pressure, or nagging pain in the abdominal area.  You have persistent nausea, vomiting, or diarrhea.  You have a bad smelling vaginal discharge.  You have pain with urination.  You notice increased swelling in your face, hands, legs, or ankles. SEEK IMMEDIATE MEDICAL CARE IF:   You have a fever.  You are leaking fluid from your vagina.  You have spotting or bleeding from your vagina.  You have severe abdominal cramping or pain.  You have rapid weight gain or loss.  You vomit blood or material that looks like coffee grounds.  You are exposed to German measles and have never had them.  You are exposed to fifth disease or chickenpox.  You develop a severe headache.  You have shortness of breath.  You have any kind of trauma, such as from a fall or a car accident. Document Released: 01/15/2001 Document Revised: 06/07/2013 Document Reviewed: 12/01/2012 [employer] Patient Information 2015 [employer]. This information is not intended to replace advice given to you by your health care provider. Make sure you discuss any questions you have with your health care provider.   Nausea & Vomiting  Have saltine crackers or pretzels by your bed and eat a few bites before you raise your head out of bed in the morning  Eat small frequent meals throughout the day instead of large meals  Drink plenty of fluids throughout the day to stay hydrated, just don't drink a lot of fluids with your meals.  This can make your stomach fill up faster making you feel sick  Do not brush your teeth right after you eat  Products with real ginger are good for nausea, like ginger ale and ginger hard candy Make sure it says made with real ginger!  Sucking on sour candy like lemon heads is also good for nausea  If your prenatal vitamins make you nauseated, take them at night so you will sleep through the nausea  Sea Bands  If you feel like you need  medicine for the nausea & vomiting please let us know  If you are unable to keep any fluids or food down please let us know   Constipation  Drink plenty of fluid, preferably water, throughout the day  Eat foods high in fiber such as fruits, vegetables, and grains  Exercise, such as walking, is a good way to keep your bowels regular  Drink warm fluids, especially warm   prune juice, or decaf coffee  Eat a 1/2 cup of real oatmeal (not instant), 1/2 cup applesauce, and 1/2-1 cup warm prune juice every day  If needed, you may take Colace (docusate sodium) stool softener once or twice a day to help keep the stool soft. If you are pregnant, wait until you are out of your first trimester (12-14 weeks of pregnancy)  If you still are having problems with constipation, you may take Miralax once daily as needed to help keep your bowels regular.  If you are pregnant, wait until you are out of your first trimester (12-14 weeks of pregnancy)  Safe Medications in Pregnancy   Acne: Benzoyl Peroxide Salicylic Acid  Backache/Headache: Tylenol: 2 regular strength every 4 hours OR              2 Extra strength every 6 hours  Colds/Coughs/Allergies: Benadryl (alcohol free) 25 mg every 6 hours as needed Breath right strips Claritin Cepacol throat lozenges Chloraseptic throat spray Cold-Eeze- up to three times per day Cough drops, alcohol free Flonase (by prescription only) Guaifenesin Mucinex Robitussin DM (plain only, alcohol free) Saline nasal spray/drops Sudafed (pseudoephedrine) & Actifed ** use only after [redacted] weeks gestation and if you do not have high blood pressure Tylenol Vicks Vaporub Zinc lozenges Zyrtec   Constipation: Colace Ducolax suppositories Fleet enema Glycerin suppositories Metamucil Milk of magnesia Miralax Senokot Smooth move tea  Diarrhea: Kaopectate Imodium A-D  *NO pepto Bismol  Hemorrhoids: Anusol Anusol HC Preparation  H Tucks  Indigestion: Tums Maalox Mylanta Zantac  Pepcid  Insomnia: Benadryl (alcohol free) 25mg every 6 hours as needed Tylenol PM Unisom, no Gelcaps  Leg Cramps: Tums MagGel  Nausea/Vomiting:  Bonine Dramamine Emetrol Ginger extract Sea bands Meclizine  Nausea medication to take during pregnancy:  Unisom (doxylamine succinate 25 mg tablets) Take one tablet daily at bedtime. If symptoms are not adequately controlled, the dose can be increased to a maximum recommended dose of two tablets daily (1/2 tablet in the morning, 1/2 tablet mid-afternoon and one at bedtime). Vitamin B6 100mg tablets. Take one tablet twice a day (up to 200 mg per day).  Skin Rashes: Aveeno products Benadryl cream or 25mg every 6 hours as needed Calamine Lotion 1% cortisone cream  Yeast infection: Gyne-lotrimin 7 Monistat 7   **If taking multiple medications, please check labels to avoid duplicating the same active ingredients **take medication as directed on the label ** Do not exceed 4000 mg of tylenol in 24 hours **Do not take medications that contain aspirin or ibuprofen      

## 2017-05-29 LAB — PMP SCREEN PROFILE (10S), URINE
AMPHETAMINE SCREEN URINE: NEGATIVE ng/mL
BARBITURATE SCREEN URINE: NEGATIVE ng/mL
BENZODIAZEPINE SCREEN, URINE: NEGATIVE ng/mL
CANNABINOIDS UR QL SCN: POSITIVE ng/mL — AB
CREATININE(CRT), U: 135.8 mg/dL (ref 20.0–300.0)
Cocaine (Metab) Scrn, Ur: NEGATIVE ng/mL
Methadone Screen, Urine: NEGATIVE ng/mL
OPIATE SCREEN URINE: NEGATIVE ng/mL
OXYCODONE+OXYMORPHONE UR QL SCN: NEGATIVE ng/mL
PH UR, DRUG SCRN: 8.6 (ref 4.5–8.9)
PHENCYCLIDINE QUANTITATIVE URINE: NEGATIVE ng/mL
Propoxyphene Scrn, Ur: NEGATIVE ng/mL

## 2017-05-30 LAB — OBSTETRIC PANEL, INCLUDING HIV
Antibody Screen: NEGATIVE
BASOS ABS: 0 10*3/uL (ref 0.0–0.2)
Basos: 1 %
EOS (ABSOLUTE): 0 10*3/uL (ref 0.0–0.4)
EOS: 0 %
HEMOGLOBIN: 14 g/dL (ref 11.1–15.9)
HIV Screen 4th Generation wRfx: NONREACTIVE
Hematocrit: 41.4 % (ref 34.0–46.6)
Hepatitis B Surface Ag: NEGATIVE
IMMATURE GRANS (ABS): 0 10*3/uL (ref 0.0–0.1)
IMMATURE GRANULOCYTES: 0 %
LYMPHS: 29 %
Lymphocytes Absolute: 1.9 10*3/uL (ref 0.7–3.1)
MCH: 29.1 pg (ref 26.6–33.0)
MCHC: 33.8 g/dL (ref 31.5–35.7)
MCV: 86 fL (ref 79–97)
MONOS ABS: 0.4 10*3/uL (ref 0.1–0.9)
Monocytes: 6 %
NEUTROS PCT: 64 %
Neutrophils Absolute: 4.3 10*3/uL (ref 1.4–7.0)
Platelets: 257 10*3/uL (ref 150–379)
RBC: 4.81 x10E6/uL (ref 3.77–5.28)
RDW: 14.3 % (ref 12.3–15.4)
RH TYPE: NEGATIVE
RPR Ser Ql: NONREACTIVE
Rubella Antibodies, IGG: 1.03 index (ref >0.99)
WBC: 6.7 10*3/uL (ref 3.4–10.8)

## 2017-05-30 LAB — INTEGRATED 1
Crown Rump Length: 67 mm
Gest. Age on Collection Date: 12.9 weeks
MATERNAL AGE AT EDD: 26.7 a
Nuchal Translucency (NT): 1.3 mm
Number of Fetuses: 1
PAPP-A Value: 1653.9 ng/mL
WEIGHT: 101 [lb_av]

## 2017-05-30 LAB — CYTOLOGY - PAP
CHLAMYDIA, DNA PROBE: NEGATIVE
NEISSERIA GONORRHEA: NEGATIVE

## 2017-05-30 LAB — URINALYSIS, ROUTINE W REFLEX MICROSCOPIC
BILIRUBIN UA: NEGATIVE
GLUCOSE, UA: NEGATIVE
Leukocytes, UA: NEGATIVE
NITRITE UA: NEGATIVE
PROTEIN UA: NEGATIVE
RBC UA: NEGATIVE
SPEC GRAV UA: 1.022 (ref 1.005–1.030)
UUROB: 0.2 mg/dL (ref 0.2–1.0)
pH, UA: 8.5 — ABNORMAL HIGH (ref 5.0–7.5)

## 2017-05-30 LAB — URINE CULTURE: Organism ID, Bacteria: NO GROWTH

## 2017-06-04 ENCOUNTER — Encounter: Payer: Self-pay | Admitting: Advanced Practice Midwife

## 2017-06-04 ENCOUNTER — Encounter: Payer: Medicaid Other | Admitting: Obstetrics and Gynecology

## 2017-06-04 ENCOUNTER — Encounter (INDEPENDENT_AMBULATORY_CARE_PROVIDER_SITE_OTHER): Payer: Self-pay

## 2017-06-04 DIAGNOSIS — R87612 Low grade squamous intraepithelial lesion on cytologic smear of cervix (LGSIL): Secondary | ICD-10-CM | POA: Insufficient documentation

## 2017-06-25 ENCOUNTER — Ambulatory Visit (INDEPENDENT_AMBULATORY_CARE_PROVIDER_SITE_OTHER): Payer: Medicaid Other | Admitting: Advanced Practice Midwife

## 2017-06-25 VITALS — BP 112/60 | HR 84 | Wt 104.0 lb

## 2017-06-25 DIAGNOSIS — Z363 Encounter for antenatal screening for malformations: Secondary | ICD-10-CM

## 2017-06-25 DIAGNOSIS — O9989 Other specified diseases and conditions complicating pregnancy, childbirth and the puerperium: Secondary | ICD-10-CM

## 2017-06-25 DIAGNOSIS — N750 Cyst of Bartholin's gland: Secondary | ICD-10-CM

## 2017-06-25 DIAGNOSIS — Z1379 Encounter for other screening for genetic and chromosomal anomalies: Secondary | ICD-10-CM

## 2017-06-25 DIAGNOSIS — Z113 Encounter for screening for infections with a predominantly sexual mode of transmission: Secondary | ICD-10-CM

## 2017-06-25 DIAGNOSIS — Z331 Pregnant state, incidental: Secondary | ICD-10-CM

## 2017-06-25 DIAGNOSIS — Z01419 Encounter for gynecological examination (general) (routine) without abnormal findings: Secondary | ICD-10-CM

## 2017-06-25 DIAGNOSIS — Z3A17 17 weeks gestation of pregnancy: Secondary | ICD-10-CM

## 2017-06-25 DIAGNOSIS — Z1389 Encounter for screening for other disorder: Secondary | ICD-10-CM

## 2017-06-25 LAB — POCT URINALYSIS DIPSTICK
GLUCOSE UA: NEGATIVE
KETONES UA: NEGATIVE
LEUKOCYTES UA: NEGATIVE
Nitrite, UA: NEGATIVE
PROTEIN UA: NEGATIVE
RBC UA: NEGATIVE

## 2017-06-25 NOTE — Progress Notes (Signed)
  [id number] Date of Delivery: 12/03/17  Blood pressure 112/60, pulse 84, weight 104 lb (47.2 kg), last menstrual period 03/10/2017, not currently breastfeeding.    Pt no showed for Bartholin gland drainage, wants to reschedule.  It's now "starting to irritate" her  Letter had been sent regarding colpo, read by pt on 06/04/17, but has not made appt for colpo. Will get Bartholin gland fixed first  Wants STD testing d/t "some stuff happening" .  BP weight and urine results all reviewed and noted.  Please refer to the obstetrical flow sheet for the fundal height and fetal heart rate documentation:  Patient denies any bleeding and no rupture of membranes symptoms or regular contractions. Patient is without complaints. All questions were answered.   Physical Assessment:   Vitals:   06/25/17 1039  BP: 112/60  Pulse: 84  Weight: 104 lb (47.2 kg)  Body mass index is 18.42 kg/m.        Physical Examination:   General appearance: Well appearing, and in no distress  Mental status: Alert, oriented to person, place, and time  Skin: Warm & dry  Cardiovascular: Normal heart rate noted  Respiratory: Normal respiratory effort, no distress  Abdomen: Soft, gravid, nontender  Pelvic: Cervical exam deferred Left bartholin cyst now size of a plum        Extremities: Edema: None  Fetal Status: Fetal Heart Rate (bpm): 139        Results for orders placed or performed in visit on 06/25/17 (from the past 24 hour(s))  POCT Urinalysis Dipstick   Collection Time: 06/25/17 10:42 AM  Result Value Ref Range   Color, UA     Clarity, UA     Glucose, UA Negative Negative   Bilirubin, UA     Ketones, UA neg    Spec Grav, UA  1.010 - 1.025   Blood, UA neg    pH, UA  5.0 - 8.0   Protein, UA Negative Negative   Urobilinogen, UA  0.2 or 1.0 E.U./dL   Nitrite, UA neg    Leukocytes, UA Negative Negative   Appearance     Odor       Orders Placed This Encounter  Procedures  . US OB Comp  + 14 Wk  . INTEGRATED 2  . NuSwab Vaginitis Plus (VG+)  . POCT Urinalysis Dipstick     Plan:  Continued routine obstetrical care,   Consider marsupialization of Bartholin gland d/t recurrence/hx Word catheter use  Return in about 1 week (around 07/02/2017) for ZO:XWRUEAV/WUJW/[id number] cyst drainage (allow extra time) w/MD;  3 weeks for LROB/colpo w/MD.

## 2017-06-25 NOTE — Patient Instructions (Signed)
Rebekah [name], I greatly value your feedback.  If you receive a survey following your visit with [place] today, we appreciate you taking the time to fill it out.  Thanks, Cathie Beams, CNM     Second Trimester of Pregnancy The second trimester is from week 14 through week 27 (months 4 through 6). The second trimester is often a time when you feel your best. Your body has adjusted to being pregnant, and you begin to feel better physically. Usually, morning sickness has lessened or quit completely, you may have more energy, and you may have an increase in appetite. The second trimester is also a time when the fetus is growing rapidly. At the end of the sixth month, the fetus is about 9 inches long and weighs about 1 pounds. You will likely begin to feel the baby move (quickening) between 16 and 20 weeks of pregnancy. Body changes during your second trimester Your body continues to go through many changes during your second trimester. The changes vary from woman to woman.  Your weight will continue to increase. You will notice your lower abdomen bulging out.  You may begin to get stretch marks on your hips, abdomen, and breasts.  You may develop headaches that can be relieved by medicines. The medicines should be approved by your health care provider.  You may urinate more often because the fetus is pressing on your bladder.  You may develop or continue to have heartburn as a result of your pregnancy.  You may develop constipation because certain hormones are causing the muscles that push waste through your intestines to slow down.  You may develop hemorrhoids or swollen, bulging veins (varicose veins).  You may have back pain. This is caused by: ? Weight gain. ? Pregnancy hormones that are relaxing the joints in your pelvis. ? A shift in weight and the muscles that support your balance.  Your breasts will continue to grow and they will continue to become tender.  Your gums  may bleed and may be sensitive to brushing and flossing.  Dark spots or blotches (chloasma, mask of pregnancy) may develop on your face. This will likely fade after the baby is born.  A dark line from your belly button to the pubic area (linea nigra) may appear. This will likely fade after the baby is born.  You may have changes in your hair. These can include thickening of your hair, rapid growth, and changes in texture. Some women also have hair loss during or after pregnancy, or hair that feels dry or thin. Your hair will most likely return to normal after your baby is born.  What to expect at prenatal visits During a routine prenatal visit:  You will be weighed to make sure you and the fetus are growing normally.  Your blood pressure will be taken.  Your abdomen will be measured to track your baby's growth.  The fetal heartbeat will be listened to.  Any test results from the previous visit will be discussed.  Your health care provider may ask you:  How you are feeling.  If you are feeling the baby move.  If you have had any abnormal symptoms, such as leaking fluid, bleeding, severe headaches, or abdominal cramping.  If you are using any tobacco products, including cigarettes, chewing tobacco, and electronic cigarettes.  If you have any questions.  Other tests that may be performed during your second trimester include:  Blood tests that check for: ? Low iron levels (anemia). ? High  blood sugar that affects pregnant women (gestational diabetes) between 42 and 28 weeks. ? Rh antibodies. This is to check for a protein on red blood cells (Rh factor).  Urine tests to check for infections, diabetes, or protein in the urine.  An ultrasound to confirm the proper growth and development of the baby.  An amniocentesis to check for possible genetic problems.  Fetal screens for spina bifida and Down syndrome.  HIV (human immunodeficiency virus) testing. Routine prenatal testing  includes screening for HIV, unless you choose not to have this test.  Follow these instructions at home: Medicines  Follow your health care provider's instructions regarding medicine use. Specific medicines may be either safe or unsafe to take during pregnancy.  Take a prenatal vitamin that contains at least 600 micrograms (mcg) of folic acid.  If you develop constipation, try taking a stool softener if your health care provider approves. Eating and drinking  Eat a balanced diet that includes fresh fruits and vegetables, whole grains, good sources of protein such as meat, eggs, or tofu, and low-fat dairy. Your health care provider will help you determine the amount of weight gain that is right for you.  Avoid raw meat and uncooked cheese. These carry germs that can cause birth defects in the baby.  If you have low calcium intake from food, talk to your health care provider about whether you should take a daily calcium supplement.  Limit foods that are high in fat and processed sugars, such as fried and sweet foods.  To prevent constipation: ? Drink enough fluid to keep your urine clear or pale yellow. ? Eat foods that are high in fiber, such as fresh fruits and vegetables, whole grains, and beans. Activity  Exercise only as directed by your health care provider. Most women can continue their usual exercise routine during pregnancy. Try to exercise for 30 minutes at least 5 days a week. Stop exercising if you experience uterine contractions.  Avoid heavy lifting, wear low heel shoes, and practice good posture.  A sexual relationship may be continued unless your health care provider directs you otherwise. Relieving pain and discomfort  Wear a good support bra to prevent discomfort from breast tenderness.  Take warm sitz baths to soothe any pain or discomfort caused by hemorrhoids. Use hemorrhoid cream if your health care provider approves.  Rest with your legs elevated if you have  leg cramps or low back pain.  If you develop varicose veins, wear support hose. Elevate your feet for 15 minutes, 3-4 times a day. Limit salt in your diet. Prenatal Care  Write down your questions. Take them to your prenatal visits.  Keep all your prenatal visits as told by your health care provider. This is important. Safety  Wear your seat belt at all times when driving.  Make a list of emergency phone numbers, including numbers for family, friends, the hospital, and police and fire departments. General instructions  Ask your health care provider for a referral to a local prenatal education class. Begin classes no later than the beginning of month 6 of your pregnancy.  Ask for help if you have counseling or nutritional needs during pregnancy. Your health care provider can offer advice or refer you to specialists for help with various needs.  Do not use hot tubs, steam rooms, or saunas.  Do not douche or use tampons or scented sanitary pads.  Do not cross your legs for long periods of time.  Avoid cat litter boxes and soil  used by cats. These carry germs that can cause birth defects in the baby and possibly loss of the fetus by miscarriage or stillbirth.  Avoid all smoking, herbs, alcohol, and unprescribed drugs. Chemicals in these products can affect the formation and growth of the baby.  Do not use any products that contain nicotine or tobacco, such as cigarettes and e-cigarettes. If you need help quitting, ask your health care provider.  Visit your dentist if you have not gone yet during your pregnancy. Use a soft toothbrush to brush your teeth and be gentle when you floss. Contact a health care provider if:  You have dizziness.  You have mild pelvic cramps, pelvic pressure, or nagging pain in the abdominal area.  You have persistent nausea, vomiting, or diarrhea.  You have a bad smelling vaginal discharge.  You have pain when you urinate. Get help right away if:  You  have a fever.  You are leaking fluid from your vagina.  You have spotting or bleeding from your vagina.  You have severe abdominal cramping or pain.  You have rapid weight gain or weight loss.  You have shortness of breath with chest pain.  You notice sudden or extreme swelling of your face, hands, ankles, feet, or legs.  You have not felt your baby move in over an hour.  You have severe headaches that do not go away when you take medicine.  You have vision changes. Summary  The second trimester is from week 14 through week 27 (months 4 through 6). It is also a time when the fetus is growing rapidly.  Your body goes through many changes during pregnancy. The changes vary from woman to woman.  Avoid all smoking, herbs, alcohol, and unprescribed drugs. These chemicals affect the formation and growth your baby.  Do not use any tobacco products, such as cigarettes, chewing tobacco, and e-cigarettes. If you need help quitting, ask your health care provider.  Contact your health care provider if you have any questions. Keep all prenatal visits as told by your health care provider. This is important. This information is not intended to replace advice given to you by your health care provider. Make sure you discuss any questions you have with your health care provider.      CHILDBIRTH CLASSES [phone] is the phone number for Pregnancy Classes or hospital tours at [facility] will be referred to  [email or web address] for more information on childbirth classes  At this site you may register for classes. You may sign up for a waiting list if classes are full. Please SIGN UP FOR THIS!.   When the waiting list becomes long, sometimes new classes can be added.

## 2017-06-27 LAB — INTEGRATED 2
ADSF: 1.49
AFP MoM: 1.02
Alpha-Fetoprotein: 43.2 ng/mL
Crown Rump Length: 67 mm
DIA MOM: 0.46
DIA VALUE: 101.2 pg/mL
Estriol, Unconjugated: 1.59 ng/mL
GESTATIONAL AGE: 16.9 wk
Gest. Age on Collection Date: 12.9 weeks
HCG VALUE: 13.1 [IU]/mL
MATERNAL AGE AT EDD: 26.7 a
NUMBER OF FETUSES: 1
Nuchal Translucency (NT): 1.3 mm
Nuchal Translucency MoM: 0.86
PAPP-A MOM: 0.99
PAPP-A VALUE: 1653.9 ng/mL
TEST RESULTS: NEGATIVE
WEIGHT: 101 [lb_av]
WEIGHT: 101 [lb_av]
hCG MoM: 0.35

## 2017-06-27 LAB — NUSWAB VAGINITIS PLUS (VG+)
Atopobium vaginae: HIGH Score — AB
CANDIDA ALBICANS, NAA: NEGATIVE
CHLAMYDIA TRACHOMATIS, NAA: NEGATIVE
Candida glabrata, NAA: NEGATIVE
MEGASPHAERA 1: HIGH {score} — AB
Neisseria gonorrhoeae, NAA: NEGATIVE
Trich vag by NAA: NEGATIVE

## 2017-07-02 ENCOUNTER — Other Ambulatory Visit: Payer: Self-pay | Admitting: Advanced Practice Midwife

## 2017-07-02 ENCOUNTER — Encounter (INDEPENDENT_AMBULATORY_CARE_PROVIDER_SITE_OTHER): Payer: Self-pay

## 2017-07-02 MED ORDER — METRONIDAZOLE 500 MG PO TABS: 500.0000 mg | ORAL_TABLET | Freq: Two times a day (BID) | ORAL | 0 refills | Status: DC

## 2017-07-02 NOTE — Progress Notes (Signed)
Flagyl for BV (nuSwab +)

## 2017-07-04 ENCOUNTER — Other Ambulatory Visit: Payer: Self-pay

## 2017-07-04 ENCOUNTER — Ambulatory Visit (INDEPENDENT_AMBULATORY_CARE_PROVIDER_SITE_OTHER): Payer: Medicaid Other

## 2017-07-04 ENCOUNTER — Ambulatory Visit (INDEPENDENT_AMBULATORY_CARE_PROVIDER_SITE_OTHER): Payer: Medicaid Other | Admitting: Obstetrics & Gynecology

## 2017-07-04 ENCOUNTER — Encounter: Payer: Self-pay | Admitting: Obstetrics & Gynecology

## 2017-07-04 VITALS — BP 120/76 | HR 85 | Wt 108.0 lb

## 2017-07-04 DIAGNOSIS — R87612 Low grade squamous intraepithelial lesion on cytologic smear of cervix (LGSIL): Secondary | ICD-10-CM

## 2017-07-04 DIAGNOSIS — Z331 Pregnant state, incidental: Secondary | ICD-10-CM

## 2017-07-04 DIAGNOSIS — Z3A18 18 weeks gestation of pregnancy: Secondary | ICD-10-CM | POA: Diagnosis not present

## 2017-07-04 DIAGNOSIS — O9989 Other specified diseases and conditions complicating pregnancy, childbirth and the puerperium: Secondary | ICD-10-CM

## 2017-07-04 DIAGNOSIS — Z3402 Encounter for supervision of normal first pregnancy, second trimester: Secondary | ICD-10-CM

## 2017-07-04 DIAGNOSIS — Z363 Encounter for antenatal screening for malformations: Secondary | ICD-10-CM | POA: Diagnosis not present

## 2017-07-04 DIAGNOSIS — N750 Cyst of Bartholin's gland: Secondary | ICD-10-CM | POA: Diagnosis not present

## 2017-07-04 DIAGNOSIS — Z1389 Encounter for screening for other disorder: Secondary | ICD-10-CM

## 2017-07-04 LAB — POCT URINALYSIS DIPSTICK
Glucose, UA: NEGATIVE
Ketones, UA: NEGATIVE
LEUKOCYTES UA: NEGATIVE
Nitrite, UA: NEGATIVE
Protein, UA: NEGATIVE
RBC UA: NEGATIVE

## 2017-07-04 MED ORDER — AMOXICILLIN-POT CLAVULANATE 875-125 MG PO TABS: 1.0000 | ORAL_TABLET | Freq: Two times a day (BID) | ORAL | 0 refills | Status: DC

## 2017-07-04 NOTE — Progress Notes (Signed)
[place] 18+2 wks,breech,anterior pl gr 0,normal ovaries bilat,anterior pl gr 0,svp of fluid 4.4 cm,cx 3.9 cm,fhr 146 bpm,efw 237 g 50%,anatomy complete,no obvious abnormalities

## 2017-07-04 NOTE — Progress Notes (Signed)
    Bartholins Gland marsupialization:   LOW-RISK PREGNANCY VISIT Patient name: Rebekah [name] MRN [id number]  Date of birth: 1990-09-14 Chief Complaint:   Routine Prenatal Visit (u/s today)  History of Present Illness:   Rebekah [name] is a 27 y.o. G39P1001 female at [redacted]w[redacted]d with an Estimated Date of Delivery: 12/03/17 being seen today for ongoing management of a low-risk pregnancy.  Today she reports no complaints.  . Vag. Bleeding: Scant.   Marland Kitchen denies leaking of fluid. Review of Systems:   Pertinent items are noted in HPI Denies abnormal vaginal discharge w/ itching/odor/irritation, headaches, visual changes, shortness of breath, chest pain, abdominal pain, severe nausea/vomiting, or problems with urination or bowel movements unless otherwise stated above. Pertinent History Reviewed:  Reviewed past medical,surgical, social, obstetrical and family history.  Reviewed problem list, medications and allergies. Physical Assessment:   Vitals:   07/04/17 1109  BP: 120/76  Pulse: 85  Weight: 108 lb (49 kg)  Body mass index is 19.13 kg/m.        Physical Examination:   General appearance: Well appearing, and in no distress  Mental status: Alert, oriented to person, place, and time  Skin: Warm & dry  Cardiovascular: Normal heart rate noted  Respiratory: Normal respiratory effort, no distress  Abdomen: Soft, gravid, nontender  Pelvic: Cervical exam deferred         Extremities: Edema: None  Fetal Status:          No results found for this or any previous visit (from the past 24 hour(s)).  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 amoxicillin-clavulanate (AUGMENTIN) 875-125 MG tablet    Sig: Take 1 tablet by mouth 2 (two) times daily.    Dispense:  20 tablet    Refill:  0   Labs/procedures today:    Marsupialization of Bartholin's gland cyst The Bartholin's gland was isolated and prepped 1% lidocaine was injected A large stab incision was made in the entire length of the Bartholin's gland was opened up on the vulvar side A marsupialization was performed with suture placement in 4 different areas Each suture involved this superficial skin the edge of the Bartholin cyst sac in the posterior wall of Bartholin cyst sac thus obliterating the sac itself and bringing the back wall of the Bartholin's gland cyst to the skin Large amount of fluid was returned and it was mostly bloody but did not appear infected The patient tolerated procedure well The sutures used were 3 oh chromics which will dissolve spontaneously She is given Augmentin to take because of the site of surgery Quarter inch iodoform gauze was also placed to [place] nod the defect  Plan:  Continue routine obstetrical care  I will see her back next week to follow-up the Bartholin's gland cyst  Reviewed: Term labor symptoms and general obstetric precautions including but not limited to vaginal bleeding, contractions, leaking of fluid and fetal movement were reviewed in detail with the patient.  All questions were answered  Follow-up: Return in about 3 days (around 07/07/2017) for Follow up, with Dr Despina Hidden.  Orders Placed This Encounter  Procedures  . POCT urinalysis dipstick   Lazaro Arms  07/16/2017 8:46 PM

## 2017-07-07 ENCOUNTER — Ambulatory Visit (INDEPENDENT_AMBULATORY_CARE_PROVIDER_SITE_OTHER): Payer: Medicaid Other | Admitting: Obstetrics & Gynecology

## 2017-07-07 ENCOUNTER — Encounter: Payer: Self-pay | Admitting: Obstetrics & Gynecology

## 2017-07-07 VITALS — BP 112/60 | HR 83 | Wt 108.0 lb

## 2017-07-07 DIAGNOSIS — Z1389 Encounter for screening for other disorder: Secondary | ICD-10-CM

## 2017-07-07 DIAGNOSIS — Z331 Pregnant state, incidental: Secondary | ICD-10-CM

## 2017-07-07 DIAGNOSIS — N750 Cyst of Bartholin's gland: Secondary | ICD-10-CM

## 2017-07-07 LAB — POCT URINALYSIS DIPSTICK
GLUCOSE UA: NEGATIVE
KETONES UA: NEGATIVE
Leukocytes, UA: NEGATIVE
NITRITE UA: NEGATIVE
Protein, UA: NEGATIVE

## 2017-07-07 NOTE — Progress Notes (Signed)
Follow up appointment for results  Chief Complaint  Patient presents with  . Follow-up    bartholian cyst    Blood pressure 112/60, pulse 83, weight 108 lb (49 kg), last menstrual period 03/10/2017, not currently breastfeeding.  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   Orders for this encounter: Orders Placed This Encounter  Procedures  . POCT urinalysis dipstick    Impression: Bartholin's cyst  Screening for genitourinary condition - Plan: POCT urinalysis dipstick  Pregnant state, incidental - Plan: POCT urinalysis dipstick     Plan: Continue local care  Follow Up: Return in about 2 weeks (around 07/21/2017) for Follow up.       Face to face time:  15 minutes  Greater than 50% of the visit time was spent in counseling and coordination of care with the patient.  The summary and outline of the counseling and care coordination is summarized in the note above.   All questions were answered.  Past Medical History:  Diagnosis Date  . Bartholin's cyst   . Chlamydia   . Depression   . Eczema   . Headache   . Vaginal Pap smear, abnormal     Past Surgical History:  Procedure Laterality Date  . WISDOM TOOTH EXTRACTION      OB History    Gravida  2   Para  1   Term  1   Preterm  0   AB  0   Living  1     SAB  0   TAB  0   Ectopic  0   Multiple  0   Live Births  1           No Known Allergies  Social History   Socioeconomic History  . Marital status: Single    Spouse name: Not on file  . Number of children: Not on file  . Years of education: Not on file  . Highest education level:  Not on file  Occupational History  . Not on file  Social Needs  . Financial resource strain: Not on file  . Food insecurity:    Worry: Not on file    Inability: Not on file  . Transportation needs:    Medical: Not on file    Non-medical: Not on file  Tobacco Use  . Smoking status: Never Smoker  . Smokeless tobacco: Never Used  Substance and Sexual Activity  . Alcohol use: No  . Drug use: Not Currently    Types: Marijuana    Comment: 3 weeks ago  . Sexual activity: Yes    Birth control/protection: None  Lifestyle  . Physical activity:    Days per week: Not on file    Minutes per session: Not on file  . Stress: Not on file  Relationships  . Social connections:    Talks on phone: Not on file    Gets together: Not on file    Attends religious service: Not on file    Active member of club or organization: Not on file    Attends meetings of clubs or organizations: Not on file    Relationship status: Not  on file  Other Topics Concern  . Not on file  Social History Narrative  . Not on file    Family History  Problem Relation Age of Onset  . Cancer Mother        lung

## 2017-07-16 ENCOUNTER — Encounter: Payer: Medicaid Other | Admitting: Obstetrics and Gynecology

## 2017-07-21 ENCOUNTER — Ambulatory Visit (INDEPENDENT_AMBULATORY_CARE_PROVIDER_SITE_OTHER): Payer: Medicaid Other | Admitting: Obstetrics and Gynecology

## 2017-07-21 ENCOUNTER — Encounter: Payer: Self-pay | Admitting: Obstetrics and Gynecology

## 2017-07-21 VITALS — BP 94/57 | HR 87 | Wt 117.6 lb

## 2017-07-21 DIAGNOSIS — R87612 Low grade squamous intraepithelial lesion on cytologic smear of cervix (LGSIL): Secondary | ICD-10-CM

## 2017-07-21 DIAGNOSIS — Z3A20 20 weeks gestation of pregnancy: Secondary | ICD-10-CM

## 2017-07-21 DIAGNOSIS — Z1389 Encounter for screening for other disorder: Secondary | ICD-10-CM

## 2017-07-21 DIAGNOSIS — O9989 Other specified diseases and conditions complicating pregnancy, childbirth and the puerperium: Secondary | ICD-10-CM

## 2017-07-21 DIAGNOSIS — Z331 Pregnant state, incidental: Secondary | ICD-10-CM

## 2017-07-21 DIAGNOSIS — N750 Cyst of Bartholin's gland: Secondary | ICD-10-CM

## 2017-07-21 DIAGNOSIS — Z3402 Encounter for supervision of normal first pregnancy, second trimester: Secondary | ICD-10-CM

## 2017-07-21 LAB — POCT URINALYSIS DIPSTICK
Glucose, UA: NEGATIVE
PROTEIN UA: NEGATIVE

## 2017-07-21 NOTE — Progress Notes (Signed)
Patient ID: Rebekah [name], female   DOB: 17-Nov-1990, 27 y.o.   MRN: [id number] The patient has bartholin cyst and had marsupialization and says her incision is still open. Before her surgery it was painful to walk. She says that at the incision site 2 days after taking the gauze out, there was burning sensation which still persists. She couldn't have the excision of the Bartholin cyst due to her pregnancy.   Rebekah [name] 27 y.o. G2P1001 here for colposcopy for low-grade squamous intraepithelial neoplasia (LGSIL - encompassing HPV,mild dysplasia,CIN I) pap smear on 05/28/2017.  Discussed role for HPV in cervical dysplasia, need for surveillance.  Patient given informed consent, signed copy in the chart, time out was performed.  Placed in lithotomy position. Cervix viewed with speculum and colposcope after application of acetic acid.   Colposcopy adequate? Yes  no visible lesions;no biopsies obtained.   ECC specimen not obtained. All specimens were labelled and sent to pathology.   Colposcopy IMPRESSION: No visible dysplasia. Adequate colpo  Patient was given post procedure instructions. Will follow up pathology and manage accordingly.  Routine preventative health maintenance measures emphasized.      LOW-RISK PREGNANCY VISIT Patient name: Rebekah [name] MRN [id number]  Date of birth: 17-Nov-1990 Chief Complaint:   low risk ob (colpo- LSIL)  History of Present Illness:   Rebekah Songicole Waight is a 27 y.o. 232P1001 female at 6329w5d with an Estimated Date of Delivery: 12/03/17 being seen today for ongoing management of a low-risk pregnancy.  This is patients second child. Today she reports no complaints.  .  .  Movement: Present. denies leaking of fluid. Review of Systems:   Pertinent items are noted in HPI Denies abnormal vaginal discharge w/ itching/odor/irritation, headaches, visual changes, shortness of breath, chest pain, abdominal pain, severe nausea/vomiting, or problems with  urination or bowel movements unless otherwise stated above. Pertinent History Reviewed:  Reviewed past medical,surgical, social, obstetrical and family history.  Reviewed problem list, medications and allergies. Physical Assessment:   Vitals:   07/21/17 1130  BP: (!) 94/57  Pulse: 87  Weight: 117 lb 9.6 oz (53.3 kg)  Body mass index is 20.83 kg/m.        Physical Examination:   General appearance: Well appearing, and in no distress  Mental status: Alert, oriented to person, place, and time  Skin: Warm & dry  Cardiovascular: Normal heart rate noted  Respiratory: Normal respiratory effort, no distress  Abdomen: Soft, gravid, nontender  Pelvic: Cervical exam performed         Extremities: Edema: None  Fetal Status: Fetal Heart Rate (bpm): 140 Fundal Height: 20 cm Movement: Present    No results found for this or any previous visit (from the past 24 hour(s)).  Assessment & Plan:  1) Low-risk pregnancy G2P1001 at 5329w5d with an Estimated Date of Delivery: 12/03/17   2)Pap smear 6 weeks postpartum   Meds: No orders of the defined types were placed in this encounter.  Labs/procedures today: None  Plan:  Continue routine obstetrical care  Follow-up: No follow-ups on file.  Orders Placed This Encounter  Procedures  . POCT urinalysis dipstick   By signing my name below, I, Arnette NorrisMari Johnson, attest that this documentation has been prepared under the direction and in the presence of Tilda BurrowFerguson, Lipa Knauff V, MD. Electronically Signed: Arnette NorrisMari Johnson Medical Scribe. 07/21/17. 12:11 PM.  I personally performed the services described in this documentation, which was SCRIBED in my presence. The recorded information has been reviewed and considered accurate. It has  been edited as necessary during review. Jonnie Kind, MD

## 2017-08-18 ENCOUNTER — Encounter: Payer: Medicaid Other | Admitting: Women's Health

## 2017-09-09 ENCOUNTER — Encounter: Payer: Self-pay | Admitting: Obstetrics & Gynecology

## 2017-09-09 ENCOUNTER — Ambulatory Visit (INDEPENDENT_AMBULATORY_CARE_PROVIDER_SITE_OTHER): Payer: Medicaid Other | Admitting: Obstetrics & Gynecology

## 2017-09-09 VITALS — BP 121/75 | HR 87 | Wt 127.0 lb

## 2017-09-09 DIAGNOSIS — Z3402 Encounter for supervision of normal first pregnancy, second trimester: Secondary | ICD-10-CM

## 2017-09-09 DIAGNOSIS — Z331 Pregnant state, incidental: Secondary | ICD-10-CM

## 2017-09-09 DIAGNOSIS — Z1389 Encounter for screening for other disorder: Secondary | ICD-10-CM

## 2017-09-09 DIAGNOSIS — Z3A27 27 weeks gestation of pregnancy: Secondary | ICD-10-CM

## 2017-09-09 LAB — POCT URINALYSIS DIPSTICK OB
GLUCOSE, UA: NEGATIVE — AB
Ketones, UA: NEGATIVE
LEUKOCYTES UA: NEGATIVE
NITRITE UA: NEGATIVE
PROTEIN: NEGATIVE
RBC UA: NEGATIVE

## 2017-09-09 MED ORDER — FLINTSTONES COMPLETE 60 MG PO CHEW: CHEWABLE_TABLET | ORAL | 12 refills | Status: DC

## 2017-09-09 NOTE — Progress Notes (Signed)
G2P1001 5246w6d Estimated Date of Delivery: 12/03/17  Blood pressure 121/75, pulse 87, weight 127 lb (57.6 kg), last menstrual period 03/10/2017, not currently breastfeeding.   BP weight and urine results all reviewed and noted.  Please refer to the obstetrical flow sheet for the fundal height and fetal heart rate documentation:  Patient reports good fetal movement, denies any bleeding and no rupture of membranes symptoms or regular contractions. Patient is without complaints. All questions were answered.  Orders Placed This Encounter  Procedures  . POC Urinalysis Dipstick OB    Plan:  Continued routine obstetrical care, PN2 next week Bartholin's marsupialization is well healed, no evidence of recurrence  Return in about 1 week (around 09/16/2017) for PN2 1 week, 4 weeks , LROB.

## 2017-09-17 ENCOUNTER — Other Ambulatory Visit: Payer: Medicaid Other

## 2017-09-17 DIAGNOSIS — Z3A29 29 weeks gestation of pregnancy: Secondary | ICD-10-CM

## 2017-09-17 DIAGNOSIS — Z3482 Encounter for supervision of other normal pregnancy, second trimester: Secondary | ICD-10-CM

## 2017-09-17 DIAGNOSIS — Z131 Encounter for screening for diabetes mellitus: Secondary | ICD-10-CM

## 2017-09-18 LAB — GLUCOSE TOLERANCE, 2 HOURS W/ 1HR
GLUCOSE, 2 HOUR: 138 mg/dL (ref 65–152)
GLUCOSE, FASTING: 80 mg/dL (ref 65–91)
Glucose, 1 hour: 146 mg/dL (ref 65–179)

## 2017-09-18 LAB — CBC
HEMOGLOBIN: 12.7 g/dL (ref 11.1–15.9)
Hematocrit: 38.6 % (ref 34.0–46.6)
MCH: 29.3 pg (ref 26.6–33.0)
MCHC: 32.9 g/dL (ref 31.5–35.7)
MCV: 89 fL (ref 79–97)
PLATELETS: 237 10*3/uL (ref 150–450)
RBC: 4.34 x10E6/uL (ref 3.77–5.28)
RDW: 14.2 % (ref 12.3–15.4)
WBC: 7 10*3/uL (ref 3.4–10.8)

## 2017-09-18 LAB — ANTIBODY SCREEN: Antibody Screen: NEGATIVE

## 2017-09-18 LAB — HIV ANTIBODY (ROUTINE TESTING W REFLEX): HIV SCREEN 4TH GENERATION: NONREACTIVE

## 2017-09-18 LAB — RPR: RPR Ser Ql: NONREACTIVE

## 2017-10-08 ENCOUNTER — Encounter: Payer: Self-pay | Admitting: Advanced Practice Midwife

## 2017-10-08 ENCOUNTER — Ambulatory Visit (INDEPENDENT_AMBULATORY_CARE_PROVIDER_SITE_OTHER): Payer: Medicaid Other | Admitting: Advanced Practice Midwife

## 2017-10-08 VITALS — BP 123/70 | HR 86 | Wt 131.0 lb

## 2017-10-08 DIAGNOSIS — Z23 Encounter for immunization: Secondary | ICD-10-CM | POA: Diagnosis not present

## 2017-10-08 DIAGNOSIS — N750 Cyst of Bartholin's gland: Secondary | ICD-10-CM

## 2017-10-08 DIAGNOSIS — Z1389 Encounter for screening for other disorder: Secondary | ICD-10-CM

## 2017-10-08 DIAGNOSIS — O360130 Maternal care for anti-D [Rh] antibodies, third trimester, not applicable or unspecified: Secondary | ICD-10-CM | POA: Diagnosis not present

## 2017-10-08 DIAGNOSIS — Z3482 Encounter for supervision of other normal pregnancy, second trimester: Secondary | ICD-10-CM

## 2017-10-08 DIAGNOSIS — Z331 Pregnant state, incidental: Secondary | ICD-10-CM

## 2017-10-08 DIAGNOSIS — O26843 Uterine size-date discrepancy, third trimester: Secondary | ICD-10-CM

## 2017-10-08 DIAGNOSIS — Z3A32 32 weeks gestation of pregnancy: Secondary | ICD-10-CM

## 2017-10-08 LAB — POCT URINALYSIS DIPSTICK OB
Glucose, UA: NEGATIVE
KETONES UA: NEGATIVE
Nitrite, UA: NEGATIVE
POC,PROTEIN,UA: NEGATIVE
RBC UA: NEGATIVE

## 2017-10-08 MED ORDER — RHO D IMMUNE GLOBULIN 1500 UNIT/2ML IJ SOSY: 300.0000 ug | PREFILLED_SYRINGE | Freq: Once | INTRAMUSCULAR | Status: DC

## 2017-10-08 NOTE — Progress Notes (Signed)
  G2P1001 [redacted]w[redacted]d Estimated Date of Delivery: 12/03/17  Blood pressure 123/70, pulse 86, weight 131 lb (59.4 kg), last menstrual period 03/10/2017, not currently breastfeeding.   BP weight and urine results all reviewed and noted.  Please refer to the obstetrical flow sheet for the fundal height and fetal heart rate documentation: Size <dates Patient reports good fetal movement, denies any bleeding and no rupture of membranes symptoms or regular contractions. Patient is without complaints. All questions were answered.   Physical Assessment:   Vitals:   10/08/17 1022  BP: 123/70  Pulse: 86  Weight: 131 lb (59.4 kg)  Body mass index is 23.21 kg/m.        Physical Examination:   General appearance: Well appearing, and in no distress  Mental status: Alert, oriented to person, place, and time  Skin: Warm & dry  Cardiovascular: Normal heart rate noted  Respiratory: Normal respiratory effort, no distress  Abdomen: Soft, gravid, nontender  Pelvic: Cervical exam deferred         Extremities: Edema: None  Fetal Status:     Movement: Present    Results for orders placed or performed in visit on 10/08/17 (from the past 24 hour(s))  POC Urinalysis Dipstick OB   Collection Time: 10/08/17 10:33 AM  Result Value Ref Range   Color, UA     Clarity, UA     Glucose, UA Negative Negative   Bilirubin, UA     Ketones, UA neg    Spec Grav, UA     Blood, UA neg    pH, UA     POC Protein UA Negative Negative, Trace   Urobilinogen, UA     Nitrite, UA neg    Leukocytes, UA Small (1+) (A) Negative   Appearance     Odor       Orders Placed This Encounter  Procedures  . Tdap vaccine greater than or equal to 7yo IM  . Flu Vaccine QUAD 36+ mos IM (Fluarix, Quad PF)  . RHO (D) Immune Globulin  . POC Urinalysis Dipstick OB    Plan:  Continued routine obstetrical care,   Return in about 2 weeks (around 10/22/2017) for ASAP for EFW/AFI; 2 weeks For LROB.

## 2017-10-14 ENCOUNTER — Ambulatory Visit (INDEPENDENT_AMBULATORY_CARE_PROVIDER_SITE_OTHER): Payer: Medicaid Other

## 2017-10-14 DIAGNOSIS — Z3403 Encounter for supervision of normal first pregnancy, third trimester: Secondary | ICD-10-CM

## 2017-10-14 DIAGNOSIS — O26843 Uterine size-date discrepancy, third trimester: Secondary | ICD-10-CM | POA: Diagnosis not present

## 2017-10-14 DIAGNOSIS — R87612 Low grade squamous intraepithelial lesion on cytologic smear of cervix (LGSIL): Secondary | ICD-10-CM

## 2017-10-14 NOTE — Progress Notes (Signed)
[place] 32+6 wks,cephalic,anterior pl gr 3,afi 12 cm,fhr 148 bpm,normal ovaries bilat,EFW 2005 g 33%

## 2017-10-22 ENCOUNTER — Encounter: Payer: Medicaid Other | Admitting: Obstetrics and Gynecology

## 2017-10-23 ENCOUNTER — Encounter: Payer: Self-pay | Admitting: Women's Health

## 2017-10-23 ENCOUNTER — Ambulatory Visit (INDEPENDENT_AMBULATORY_CARE_PROVIDER_SITE_OTHER): Payer: Medicaid Other | Admitting: Women's Health

## 2017-10-23 VITALS — BP 118/66 | HR 85 | Wt 135.0 lb

## 2017-10-23 DIAGNOSIS — Z3A34 34 weeks gestation of pregnancy: Secondary | ICD-10-CM

## 2017-10-23 DIAGNOSIS — Z1389 Encounter for screening for other disorder: Secondary | ICD-10-CM

## 2017-10-23 DIAGNOSIS — Z3483 Encounter for supervision of other normal pregnancy, third trimester: Secondary | ICD-10-CM

## 2017-10-23 DIAGNOSIS — Z331 Pregnant state, incidental: Secondary | ICD-10-CM

## 2017-10-23 LAB — POCT URINALYSIS DIPSTICK OB
GLUCOSE, UA: NEGATIVE
Leukocytes, UA: NEGATIVE
Nitrite, UA: NEGATIVE
POC,PROTEIN,UA: NEGATIVE
RBC UA: NEGATIVE

## 2017-10-23 NOTE — Progress Notes (Signed)
   LOW-RISK PREGNANCY VISIT Patient name: Rebekah [name] MRN [id number]  Date of birth: 1990/05/31 Chief Complaint:   Routine Prenatal Visit  History of Present Illness:   Rebekah Songicole Laforte is a 27 y.o. 722P1001 female at 6517w1d with an Estimated Date of Delivery: 12/03/17 being seen today for ongoing management of a low-risk pregnancy.  Today she reports no complaints. Contractions: Not present. Vag. Bleeding: None.  Movement: Present. denies leaking of fluid. Review of Systems:   Pertinent items are noted in HPI Denies abnormal vaginal discharge w/ itching/odor/irritation, headaches, visual changes, shortness of breath, chest pain, abdominal pain, severe nausea/vomiting, or problems with urination or bowel movements unless otherwise stated above. Pertinent History Reviewed:  Reviewed past medical,surgical, social, obstetrical and family history.  Reviewed problem list, medications and allergies. Physical Assessment:   Vitals:   10/23/17 1557  BP: 118/66  Pulse: 85  Weight: 135 lb (61.2 kg)  Body mass index is 23.91 kg/m.        Physical Examination:   General appearance: Well appearing, and in no distress  Mental status: Alert, oriented to person, place, and time  Skin: Warm & dry  Cardiovascular: Normal heart rate noted  Respiratory: Normal respiratory effort, no distress  Abdomen: Soft, gravid, nontender  Pelvic: Cervical exam deferred         Extremities: Edema: None  Fetal Status: Fetal Heart Rate (bpm): 142 Fundal Height: 32 cm Movement: Present    Results for orders placed or performed in visit on 10/23/17 (from the past 24 hour(s))  POC Urinalysis Dipstick OB   Collection Time: 10/23/17  3:58 PM  Result Value Ref Range   Color, UA     Clarity, UA     Glucose, UA Negative Negative   Bilirubin, UA     Ketones, UA small    Spec Grav, UA     Blood, UA neg    pH, UA     POC Protein UA Negative Negative, Trace   Urobilinogen, UA     Nitrite, UA neg    Leukocytes, UA Negative Negative   Appearance     Odor      Assessment & Plan:  1) Low-risk pregnancy G2P1001 at 4917w1d with an Estimated Date of Delivery: 12/03/17    Meds: No orders of the defined types were placed in this encounter.  Labs/procedures today: none  Plan:  Continue routine obstetrical care   Reviewed: Preterm labor symptoms and general obstetric precautions including but not limited to vaginal bleeding, contractions, leaking of fluid and fetal movement were reviewed in detail with the patient.  All questions were answered  Follow-up: Return in about 2 weeks (around 11/06/2017) for LROB.  Orders Placed This Encounter  Procedures  . POC Urinalysis Dipstick OB   Cheral MarkerKimberly R Liv Rallis CNM, [facility] 10/23/2017 4:21 PM

## 2017-10-23 NOTE — Patient Instructions (Signed)
Jarold SongNicole Hoffmaster, I greatly value your feedback.  If you receive a survey following your visit with us today, we appreciate you taking the time to fill it out.  Thanks, Joellyn HaffKim Naima Veldhuizen, CNM, WHNP-BC   Call the office [phone]) or go to [facility] if:  You begin to have strong, frequent contractions  Your water breaks.  Sometimes it is a big gush of fluid, sometimes it is just a trickle that keeps getting your panties wet or running down your legs  You have vaginal bleeding.  It is normal to have a small amount of spotting if your cervix was checked.   You don't feel your baby moving like normal.  If you don't, get you something to eat and drink and lay down and focus on feeling your baby move.  You should feel at least 10 movements in 2 hours.  If you don't, you should call the office or go to [facility].     Preterm Labor and Birth Information The normal length of a pregnancy is 39-41 weeks. Preterm labor is when labor starts before 37 completed weeks of pregnancy. What are the risk factors for preterm labor? Preterm labor is more likely to occur in women who:  Have certain infections during pregnancy such as a bladder infection, sexually transmitted infection, or infection inside the uterus (chorioamnionitis).  Have a shorter-than-normal cervix.  Have gone into preterm labor before.  Have had surgery on their cervix.  Are younger than age 417 or older than age 26.  Are African American.  Are pregnant with twins or multiple babies (multiple gestation).  Take street drugs or smoke while pregnant.  Do not gain enough weight while pregnant.  Became pregnant shortly after having been pregnant.  What are the symptoms of preterm labor? Symptoms of preterm labor include:  Cramps similar to those that can happen during a menstrual period. The cramps may happen with diarrhea.  Pain in the abdomen or lower back.  Regular uterine contractions that may feel like  tightening of the abdomen.  A feeling of increased pressure in the pelvis.  Increased watery or bloody mucus discharge from the vagina.  Water breaking (ruptured amniotic sac).  Why is it important to recognize signs of preterm labor? It is important to recognize signs of preterm labor because babies who are born prematurely may not be fully developed. This can put them at an increased risk for:  Long-term (chronic) heart and lung problems.  Difficulty immediately after birth with regulating body systems, including blood sugar, body temperature, heart rate, and breathing rate.  Bleeding in the brain.  Cerebral palsy.  Learning difficulties.  Death.  These risks are highest for babies who are born before 34 weeks of pregnancy. How is preterm labor treated? Treatment depends on the length of your pregnancy, your condition, and the health of your baby. It may involve:  Having a stitch (suture) placed in your cervix to prevent your cervix from opening too early (cerclage).  Taking or being given medicines, such as: ? Hormone medicines. These may be given early in pregnancy to help support the pregnancy. ? Medicine to stop contractions. ? Medicines to help mature the baby's lungs. These may be prescribed if the risk of delivery is high. ? Medicines to prevent your baby from developing cerebral palsy.  If the labor happens before 34 weeks of pregnancy, you may need to stay in the hospital. What should I do if I think I am in preterm labor? If you  think that you are going into preterm labor, call your health care provider right away. How can I prevent preterm labor in future pregnancies? To increase your chance of having a full-term pregnancy:  Do not use any tobacco products, such as cigarettes, chewing tobacco, and e-cigarettes. If you need help quitting, ask your health care provider.  Do not use street drugs or medicines that have not been prescribed to you during your  pregnancy.  Talk with your health care provider before taking any herbal supplements, even if you have been taking them regularly.  Make sure you gain a healthy amount of weight during your pregnancy.  Watch for infection. If you think that you might have an infection, get it checked right away.  Make sure to tell your health care provider if you have gone into preterm labor before.  This information is not intended to replace advice given to you by your health care provider. Make sure you discuss any questions you have with your health care provider. Document Released: 04/13/2003 Document Revised: 07/04/2015 Document Reviewed: 06/14/2015 Elsevier Interactive Patient Education  2018 [employer].

## 2017-11-03 ENCOUNTER — Telehealth: Payer: Self-pay | Admitting: *Deleted

## 2017-11-03 NOTE — Telephone Encounter (Signed)
Left message @ 3:30 pm. JSY

## 2017-11-05 NOTE — Telephone Encounter (Signed)
Spoke with pt. Pt had sex 1 week ago. Pt states cervix hurts. Pt had light spotting after sex but none since then. + baby movement. No leaking of fluid. Advised no sex at this time. Pt has appt tomorrow. Advised to keep that appt and let us know if anything changes. Pt voiced understanding. JSY

## 2017-11-05 NOTE — Telephone Encounter (Signed)
Left message x 2. JSY 

## 2017-11-06 ENCOUNTER — Ambulatory Visit (INDEPENDENT_AMBULATORY_CARE_PROVIDER_SITE_OTHER): Payer: Medicaid Other | Admitting: Women's Health

## 2017-11-06 ENCOUNTER — Encounter: Payer: Self-pay | Admitting: Women's Health

## 2017-11-06 VITALS — BP 120/69 | HR 81 | Wt 132.6 lb

## 2017-11-06 DIAGNOSIS — Z3483 Encounter for supervision of other normal pregnancy, third trimester: Secondary | ICD-10-CM

## 2017-11-06 DIAGNOSIS — Z1389 Encounter for screening for other disorder: Secondary | ICD-10-CM

## 2017-11-06 DIAGNOSIS — Z331 Pregnant state, incidental: Secondary | ICD-10-CM

## 2017-11-06 DIAGNOSIS — Z3A36 36 weeks gestation of pregnancy: Secondary | ICD-10-CM

## 2017-11-06 LAB — POCT URINALYSIS DIPSTICK OB
Blood, UA: NEGATIVE
Glucose, UA: NEGATIVE
KETONES UA: NEGATIVE
Leukocytes, UA: NEGATIVE
NITRITE UA: NEGATIVE
POC,PROTEIN,UA: NEGATIVE

## 2017-11-06 NOTE — Patient Instructions (Signed)
Rebekah [name], I greatly value your feedback.  If you receive a survey following your visit with [place] today, we appreciate you taking the time to fill it out.  Thanks, Rebekah [name], CNM, WHNP-BC   Call the office [phone]) or go to [facility] if:  You begin to have strong, frequent contractions  Your water breaks.  Sometimes it is a big gush of fluid, sometimes it is just a trickle that keeps getting your panties wet or running down your legs  You have vaginal bleeding.  It is normal to have a small amount of spotting if your cervix was checked.   You don't feel your baby moving like normal.  If you don't, get you something to eat and drink and lay down and focus on feeling your baby move.  You should feel at least 10 movements in 2 hours.  If you don't, you should call the office or go to [facility].     [facility] Contractions Contractions of the uterus can occur throughout pregnancy, but they are not always a sign that you are in labor. You may have practice contractions called Braxton Hicks contractions. These false labor contractions are sometimes confused with true labor. What are Rebekah [name] contractions? Braxton Hicks contractions are tightening movements that occur in the muscles of the uterus before labor. Unlike true labor contractions, these contractions do not result in opening (dilation) and thinning of the cervix. Toward the end of pregnancy (32-34 weeks), Braxton Hicks contractions can happen more often and may become stronger. These contractions are sometimes difficult to tell apart from true labor because they can be very uncomfortable. You should not feel embarrassed if you go to the hospital with false labor. Sometimes, the only way to tell if you are in true labor is for your health care provider to look for changes in the cervix. The health care provider will do a physical exam and may monitor your contractions. If you are not in true labor, the exam should  show that your cervix is not dilating and your water has not broken. If there are other health problems associated with your pregnancy, it is completely safe for you to be sent home with false labor. You may continue to have Braxton Hicks contractions until you go into true labor. How to tell the difference between true labor and false labor True labor  Contractions last 30-70 seconds.  Contractions become very regular.  Discomfort is usually felt in the top of the uterus, and it spreads to the lower abdomen and low back.  Contractions do not go away with walking.  Contractions usually become more intense and increase in frequency.  The cervix dilates and gets thinner. False labor  Contractions are usually shorter and not as strong as true labor contractions.  Contractions are usually irregular.  Contractions are often felt in the front of the lower abdomen and in the groin.  Contractions may go away when you walk around or change positions while lying down.  Contractions get weaker and are shorter-lasting as time goes on.  The cervix usually does not dilate or become thin. Follow these instructions at home:  Take over-the-counter and prescription medicines only as told by your health care provider.  Keep up with your usual exercises and follow other instructions from your health care provider.  Eat and drink lightly if you think you are going into labor.  If Braxton Hicks contractions are making you uncomfortable: ? Change your position from lying down  or resting to walking, or change from walking to resting. ? Sit and rest in a tub of warm water. ? Drink enough fluid to keep your urine pale yellow. Dehydration may cause these contractions. ? Do slow and deep breathing several times an hour.  Keep all follow-up prenatal visits as told by your health care provider. This is important. Contact a health care provider if:  You have a fever.  You have continuous pain in  your abdomen. Get help right away if:  Your contractions become stronger, more regular, and closer together.  You have fluid leaking or gushing from your vagina.  You pass blood-tinged mucus (bloody show).  You have bleeding from your vagina.  You have low back pain that you never had before.  You feel your baby's head pushing down and causing pelvic pressure.  Your baby is not moving inside you as much as it used to. Summary  Contractions that occur before labor are called Braxton Hicks contractions, false labor, or practice contractions.  Braxton Hicks contractions are usually shorter, weaker, farther apart, and less regular than true labor contractions. True labor contractions usually become progressively stronger and regular and they become more frequent.  Manage discomfort from [facility] contractions by changing position, resting in a warm bath, drinking plenty of water, or practicing deep breathing. This information is not intended to replace advice given to you by your health care provider. Make sure you discuss any questions you have with your health care provider. Document Released: 06/06/2016 Document Revised: 06/06/2016 Document Reviewed: 06/06/2016 Elsevier Interactive Patient Education  2018 [employer].

## 2017-11-06 NOTE — Progress Notes (Signed)
   LOW-RISK PREGNANCY VISIT Patient name: Rebekah [name] MRN [id number]  Date of birth: 1990-04-23 Chief Complaint:   Routine Prenatal Visit  History of Present Illness:   Rebekah [name] is a 27 y.o. G102P1001 female at [redacted]w[redacted]d with an Estimated Date of Delivery: 12/03/17 being seen today for ongoing management of a low-risk pregnancy.  Today she reports had sex last Friday and cervix has been sore ever since, had spotting after that has resolved. Contractions: Irregular.  .  Movement: Present. denies leaking of fluid. Review of Systems:   Pertinent items are noted in HPI Denies abnormal vaginal discharge w/ itching/odor/irritation, headaches, visual changes, shortness of breath, chest pain, abdominal pain, severe nausea/vomiting, or problems with urination or bowel movements unless otherwise stated above. Pertinent History Reviewed:  Reviewed past medical,surgical, social, obstetrical and family history.  Reviewed problem list, medications and allergies. Physical Assessment:   Vitals:   11/06/17 1620  BP: 120/69  Pulse: 81  Weight: 132 lb 9.6 oz (60.1 kg)  Body mass index is 23.49 kg/m.        Physical Examination:   General appearance: Well appearing, and in no distress  Mental status: Alert, oriented to person, place, and time  Skin: Warm & dry  Cardiovascular: Normal heart rate noted  Respiratory: Normal respiratory effort, no distress  Abdomen: Soft, gravid, nontender  Pelvic: Cervical exam deferred         Extremities: Edema: None  Fetal Status: Fetal Heart Rate (bpm): 142 Fundal Height: 35 cm Movement: Present    Results for orders placed or performed in visit on 11/06/17 (from the past 24 hour(s))  POC Urinalysis Dipstick OB   Collection Time: 11/06/17  4:23 PM  Result Value Ref Range   Color, UA     Clarity, UA     Glucose, UA Negative Negative   Bilirubin, UA     Ketones, UA neg    Spec Grav, UA     Blood, UA neg    pH, UA     POC Protein UA Negative  Negative, Trace   Urobilinogen, UA     Nitrite, UA neg    Leukocytes, UA Negative Negative   Appearance     Odor      Assessment & Plan:  1) Low-risk pregnancy G2P1001 at [redacted]w[redacted]d with an Estimated Date of Delivery: 12/03/17   2) Sore cervix after sex   Meds: No orders of the defined types were placed in this encounter.  Labs/procedures today: none  Plan:  Continue routine obstetrical care   Reviewed: Preterm labor symptoms and general obstetric precautions including but not limited to vaginal bleeding, contractions, leaking of fluid and fetal movement were reviewed in detail with the patient.  All questions were answered  Follow-up: Return in about 1 week (around 11/13/2017) for LROB.  Orders Placed This Encounter  Procedures  . POC Urinalysis Dipstick OB   Cheral Marker CNM, [facility] 11/06/2017 4:37 PM

## 2017-11-13 ENCOUNTER — Ambulatory Visit (INDEPENDENT_AMBULATORY_CARE_PROVIDER_SITE_OTHER): Payer: Medicaid Other | Admitting: Obstetrics and Gynecology

## 2017-11-13 ENCOUNTER — Encounter: Payer: Self-pay | Admitting: Obstetrics and Gynecology

## 2017-11-13 VITALS — BP 129/71 | HR 92 | Wt 136.4 lb

## 2017-11-13 DIAGNOSIS — Z331 Pregnant state, incidental: Secondary | ICD-10-CM

## 2017-11-13 DIAGNOSIS — Z3A37 37 weeks gestation of pregnancy: Secondary | ICD-10-CM

## 2017-11-13 DIAGNOSIS — Z3483 Encounter for supervision of other normal pregnancy, third trimester: Secondary | ICD-10-CM

## 2017-11-13 DIAGNOSIS — Z1389 Encounter for screening for other disorder: Secondary | ICD-10-CM

## 2017-11-13 LAB — POCT URINALYSIS DIPSTICK OB
Glucose, UA: NEGATIVE
Ketones, UA: NEGATIVE
Leukocytes, UA: NEGATIVE
Nitrite, UA: NEGATIVE
POC,PROTEIN,UA: NEGATIVE
RBC UA: NEGATIVE

## 2017-11-13 NOTE — Progress Notes (Signed)
Patient ID: Rebekah [name], female   DOB: 25-Mar-1990, 27 y.o.   MRN: [id number]   LOW-RISK PREGNANCY VISIT Patient name: Rebekah [name] MRN [id number]  Date of birth: December 04, 1990 Chief Complaint:   Routine Prenatal Visit  History of Present Illness:   Rebekah [name] is a 27 y.o. G30P1001 female at [redacted]w[redacted]d with an Estimated Date of Delivery: 12/03/17 being seen today for ongoing management of a low-risk pregnancy.  Today she reports no complaints. [facility] is here to see her today. She has some pain in her pelvic area when she bends over and picks up her daughter.   Contractions: Irritability. Vag. Bleeding: None.  Movement: Present. denies leaking of fluid. Review of Systems:   Pertinent items are noted in HPI Denies abnormal vaginal discharge w/ itching/odor/irritation, headaches, visual changes, shortness of breath, chest pain, abdominal pain, severe nausea/vomiting, or problems with urination or bowel movements unless otherwise stated above. Pertinent History Reviewed:  Reviewed past medical,surgical, social, obstetrical and family history.  Reviewed problem list, medications and allergies. Physical Assessment:   Vitals:   11/13/17 1621  BP: 129/71  Pulse: 92  Weight: 136 lb 6.4 oz (61.9 kg)  Body mass index is 24.16 kg/m.        Physical Examination:   General appearance: Well appearing, and in no distress  Mental status: Alert, oriented to person, place, and time  Skin: Warm & dry  Cardiovascular: Normal heart rate noted  Respiratory: Normal respiratory effort, no distress  Abdomen: Soft, gravid, nontender  Pelvic: Cervical exam performed        Extremities: Edema: None  Fetal Status: Fetal Heart Rate (bpm): 152 Fundal Height: 34 cm Movement: Present    Results for orders placed or performed in visit on 11/13/17 (from the past 24 hour(s))  POC Urinalysis Dipstick OB   Collection Time: 11/13/17  4:25 PM  Result Value Ref Range   Color, UA     Clarity, UA     Glucose, UA Negative Negative   Bilirubin, UA     Ketones, UA neg    Spec Grav, UA     Blood, UA neg    pH, UA     POC Protein UA Negative Negative, Trace   Urobilinogen, UA     Nitrite, UA neg    Leukocytes, UA Negative Negative   Appearance     Odor      Assessment & Plan:  1) Low-risk pregnancy G2P1001 at [redacted]w[redacted]d with an Estimated Date of Delivery: 12/03/17   2) GC/CHL and GBS tests done today   Plan:  Continue routine obstetrical care   Meds: No orders of the defined types were placed in this encounter.  Labs/procedures today: none  Reviewed: Term labor symptoms and general obstetric precautions including but not limited to vaginal bleeding, contractions, leaking of fluid and fetal movement were reviewed in detail with the patient.  All questions were answered  Follow-up: 1 wk  Orders Placed This Encounter  Procedures  . GC/Chlamydia Probe Amp([facility])  . Culture, beta strep (group b only)  . POC Urinalysis Dipstick OB   By signing my name below, I, Pietro Cassis, attest that this documentation has been prepared under the direction and in the presence of Tilda Burrow, MD. Electronically Signed: Pietro Cassis, Medical Scribe. 11/13/17. 4:43 PM.  I personally performed the services described in this documentation, which was SCRIBED in my presence. The recorded information has been reviewed and considered accurate. It has been edited as necessary during review. Cyndi Lennert  Glo Herring, MD

## 2017-11-15 LAB — GC/CHLAMYDIA PROBE AMP
Chlamydia trachomatis, NAA: NEGATIVE
Neisseria gonorrhoeae by PCR: NEGATIVE

## 2017-11-17 ENCOUNTER — Encounter (HOSPITAL_COMMUNITY): Payer: Self-pay

## 2017-11-17 ENCOUNTER — Inpatient Hospital Stay (HOSPITAL_COMMUNITY)
Admission: AD | Admit: 2017-11-17 | Discharge: 2017-11-17 | Disposition: A | Discharge status: Home / Self Care | Payer: Medicaid Other | Source: Ambulatory Visit | Attending: Obstetrics and Gynecology | Admitting: Obstetrics and Gynecology

## 2017-11-17 DIAGNOSIS — O471 False labor at or after 37 completed weeks of gestation: Secondary | ICD-10-CM | POA: Diagnosis not present

## 2017-11-17 DIAGNOSIS — Z3483 Encounter for supervision of other normal pregnancy, third trimester: Secondary | ICD-10-CM

## 2017-11-17 DIAGNOSIS — Z3A38 38 weeks gestation of pregnancy: Secondary | ICD-10-CM | POA: Diagnosis not present

## 2017-11-17 DIAGNOSIS — R87612 Low grade squamous intraepithelial lesion on cytologic smear of cervix (LGSIL): Secondary | ICD-10-CM

## 2017-11-17 LAB — CULTURE, BETA STREP (GROUP B ONLY): STREP GP B CULTURE: NEGATIVE

## 2017-11-17 NOTE — Discharge Instructions (Signed)
Braxton Hicks Contractions °Contractions of the uterus can occur throughout pregnancy, but they are not always a sign that you are in labor. You may have practice contractions called Braxton Hicks contractions. These false labor contractions are sometimes confused with true labor. °What are Braxton Hicks contractions? °Braxton Hicks contractions are tightening movements that occur in the muscles of the uterus before labor. Unlike true labor contractions, these contractions do not result in opening (dilation) and thinning of the cervix. Toward the end of pregnancy (32-34 weeks), Braxton Hicks contractions can happen more often and may become stronger. These contractions are sometimes difficult to tell apart from true labor because they can be very uncomfortable. You should not feel embarrassed if you go to the hospital with false labor. °Sometimes, the only way to tell if you are in true labor is for your health care provider to look for changes in the cervix. The health care provider will do a physical exam and may monitor your contractions. If you are not in true labor, the exam should show that your cervix is not dilating and your water has not broken. °If there are other health problems associated with your pregnancy, it is completely safe for you to be sent home with false labor. You may continue to have Braxton Hicks contractions until you go into true labor. °How to tell the difference between true labor and false labor °True labor °· Contractions last 30-70 seconds. °· Contractions become very regular. °· Discomfort is usually felt in the top of the uterus, and it spreads to the lower abdomen and low back. °· Contractions do not go away with walking. °· Contractions usually become more intense and increase in frequency. °· The cervix dilates and gets thinner. °False labor °· Contractions are usually shorter and not as strong as true labor contractions. °· Contractions are usually irregular. °· Contractions  are often felt in the front of the lower abdomen and in the groin. °· Contractions may go away when you walk around or change positions while lying down. °· Contractions get weaker and are shorter-lasting as time goes on. °· The cervix usually does not dilate or become thin. °Follow these instructions at home: °· Take over-the-counter and prescription medicines only as told by your health care provider. °· Keep up with your usual exercises and follow other instructions from your health care provider. °· Eat and drink lightly if you think you are going into labor. °· If Braxton Hicks contractions are making you uncomfortable: °? Change your position from lying down or resting to walking, or change from walking to resting. °? Sit and rest in a tub of warm water. °? Drink enough fluid to keep your urine pale yellow. Dehydration may cause these contractions. °? Do slow and deep breathing several times an hour. °· Keep all follow-up prenatal visits as told by your health care provider. This is important. °Contact a health care provider if: °· You have a fever. °· You have continuous pain in your abdomen. °Get help right away if: °· Your contractions become stronger, more regular, and closer together. °· You have fluid leaking or gushing from your vagina. °· You pass blood-tinged mucus (bloody show). °· You have bleeding from your vagina. °· You have low back pain that you never had before. °· You feel your baby’s head pushing down and causing pelvic pressure. °· Your baby is not moving inside you as much as it used to. °Summary °· Contractions that occur before labor are called Braxton   Hicks contractions, false labor, or practice contractions. °· Braxton Hicks contractions are usually shorter, weaker, farther apart, and less regular than true labor contractions. True labor contractions usually become progressively stronger and regular and they become more frequent. °· Manage discomfort from Braxton Hicks contractions by  changing position, resting in a warm bath, drinking plenty of water, or practicing deep breathing. °This information is not intended to replace advice given to you by your health care provider. Make sure you discuss any questions you have with your health care provider. °Document Released: 06/06/2016 Document Revised: 06/06/2016 Document Reviewed: 06/06/2016 °Elsevier Interactive Patient Education © 2018 [employer] ° °

## 2017-11-17 NOTE — MAU Note (Signed)
Ms. Rebekah [name] is a [redacted]w[redacted]d G2P1001 at [redacted]w[redacted]d who presents to MAU today with complaint of contractions q 1-2 since 1900. She denies vaginal bleeding. She denies LOF. Her GBS status is negative. .   BP 130/77 (BP Location: Right Arm)   Pulse 83   Temp 97.6 F (36.4 C) (Oral)   Resp 18   Ht 5\' 3"  (1.6 m)   Wt 61.2 kg   LMP 03/10/2017   SpO2 100%   BMI 23.91 kg/m  Dilation: 1.5 Effacement (%): 50 Station: -2 Presentation: Vertex Exam by:: Mychelle Kendra, RN

## 2017-11-17 NOTE — MAU Note (Signed)
I have communicated with Phillips,MD and reviewed vital signs:  Vitals:   11/17/17 0522 11/17/17 0659  BP: 130/77 118/73  Pulse: 83 78  Resp: 18 18  Temp: 97.6 F (36.4 C)   SpO2: 100%     Vaginal exam:  Dilation: 1.5 Effacement (%): 50 Station: -2 Presentation: Vertex Exam by:: Marylou Wages, RN ,   Also reviewed contraction pattern and that non-stress test is reactive.  It has been documented that patient is contracting every 2-4 minutes with nocervical change over 1 hour not indicating active labor.  Patient denies any other complaints.  Based on this report provider has given order for discharge.  A discharge order and diagnosis entered by a provider.   Labor discharge instructions reviewed with patient.

## 2017-11-17 NOTE — MAU Note (Signed)
Cntx since 1900, now 1-1/2 apart.  Denies LOF or bleeding. +FM.

## 2017-11-20 ENCOUNTER — Other Ambulatory Visit: Payer: Self-pay

## 2017-11-20 ENCOUNTER — Encounter: Payer: Self-pay | Admitting: Advanced Practice Midwife

## 2017-11-20 ENCOUNTER — Ambulatory Visit (INDEPENDENT_AMBULATORY_CARE_PROVIDER_SITE_OTHER): Payer: Medicaid Other | Admitting: Advanced Practice Midwife

## 2017-11-20 VITALS — BP 119/70 | HR 81 | Wt 135.0 lb

## 2017-11-20 DIAGNOSIS — Z331 Pregnant state, incidental: Secondary | ICD-10-CM

## 2017-11-20 DIAGNOSIS — Z1389 Encounter for screening for other disorder: Secondary | ICD-10-CM

## 2017-11-20 DIAGNOSIS — Z3A38 38 weeks gestation of pregnancy: Secondary | ICD-10-CM

## 2017-11-20 DIAGNOSIS — Z3483 Encounter for supervision of other normal pregnancy, third trimester: Secondary | ICD-10-CM

## 2017-11-20 LAB — POCT URINALYSIS DIPSTICK OB
Blood, UA: NEGATIVE
Glucose, UA: NEGATIVE
Ketones, UA: NEGATIVE
LEUKOCYTES UA: NEGATIVE
NITRITE UA: NEGATIVE
POC,PROTEIN,UA: NEGATIVE

## 2017-11-20 NOTE — Progress Notes (Signed)
  G2P1001 [redacted]w[redacted]d Estimated Date of Delivery: 12/03/17  Blood pressure 119/70, pulse 81, weight 135 lb (61.2 kg), last menstrual period 03/10/2017, not currently breastfeeding.   BP weight and urine results all reviewed and noted.  Please refer to the obstetrical flow sheet for the fundal height and fetal heart rate documentation:  Patient reports good fetal movement, denies any bleeding and no rupture of membranes symptoms or regular contractions. Patient is without complaints. All questions were answered.   Physical Assessment:   Vitals:   11/20/17 1611  BP: 119/70  Pulse: 81  Weight: 135 lb (61.2 kg)  Body mass index is 23.91 kg/m.        Physical Examination:   General appearance: Well appearing, and in no distress  Mental status: Alert, oriented to person, place, and time  Skin: Warm & dry  Cardiovascular: Normal heart rate noted  Respiratory: Normal respiratory effort, no distress  Abdomen: Soft, gravid, nontender  Pelvic: Cervical exam performed  Dilation: 1 Effacement (%): Thick Station: -1  Extremities: Edema: None  Fetal Status: Fetal Heart Rate (bpm): 148 Fundal Height: 36 cm Movement: Present Presentation: Vertex  Results for orders placed or performed in visit on 11/20/17 (from the past 24 hour(s))  POC Urinalysis Dipstick OB   Collection Time: 11/20/17  4:09 PM  Result Value Ref Range   Color, UA     Clarity, UA     Glucose, UA Negative Negative   Bilirubin, UA     Ketones, UA neg    Spec Grav, UA     Blood, UA neg    pH, UA     POC Protein UA Negative Negative, Trace   Urobilinogen, UA     Nitrite, UA neg    Leukocytes, UA Negative Negative   Appearance     Odor       Orders Placed This Encounter  Procedures  . POC Urinalysis Dipstick OB    Plan:  Continued routine obstetrical care,   Return in about 1 week (around 11/27/2017) for LROB.

## 2017-11-20 NOTE — Patient Instructions (Signed)

## 2017-11-26 ENCOUNTER — Ambulatory Visit (INDEPENDENT_AMBULATORY_CARE_PROVIDER_SITE_OTHER): Payer: Medicaid Other | Admitting: Advanced Practice Midwife

## 2017-11-26 ENCOUNTER — Encounter: Payer: Self-pay | Admitting: Advanced Practice Midwife

## 2017-11-26 VITALS — BP 122/70 | HR 74 | Wt 134.0 lb

## 2017-11-26 DIAGNOSIS — Z1389 Encounter for screening for other disorder: Secondary | ICD-10-CM

## 2017-11-26 DIAGNOSIS — Z3A39 39 weeks gestation of pregnancy: Secondary | ICD-10-CM | POA: Diagnosis not present

## 2017-11-26 DIAGNOSIS — Z3483 Encounter for supervision of other normal pregnancy, third trimester: Secondary | ICD-10-CM | POA: Diagnosis not present

## 2017-11-26 DIAGNOSIS — Z331 Pregnant state, incidental: Secondary | ICD-10-CM

## 2017-11-26 LAB — POCT URINALYSIS DIPSTICK OB
Glucose, UA: NEGATIVE
KETONES UA: NEGATIVE
Nitrite, UA: NEGATIVE
POC,PROTEIN,UA: NEGATIVE
RBC UA: NEGATIVE

## 2017-11-26 NOTE — Progress Notes (Signed)
  G2P1001 [redacted]w[redacted]d Estimated Date of Delivery: 12/03/17  Blood pressure 122/70, pulse 74, weight 134 lb (60.8 kg), last menstrual period 03/10/2017.   BP weight and urine results all reviewed and noted.  Please refer to the obstetrical flow sheet for the fundal height and fetal heart rate documentation:  Patient reports good fetal movement, denies any bleeding and no rupture of membranes symptoms or regular contractions. Patient is without complaints. All questions were answered.   Physical Assessment:   Vitals:   11/26/17 1612  BP: 122/70  Pulse: 74  Weight: 134 lb (60.8 kg)  Body mass index is 23.74 kg/m.        Physical Examination:   General appearance: Well appearing, and in no distress  Mental status: Alert, oriented to person, place, and time  Skin: Warm & dry  Cardiovascular: Normal heart rate noted  Respiratory: Normal respiratory effort, no distress  Abdomen: Soft, gravid, nontender  Pelvic: Cervical exam deferred         Extremities: Edema: None  Fetal Status:     Movement: Present    Results for orders placed or performed in visit on 11/26/17 (from the past 24 hour(s))  POC Urinalysis Dipstick OB   Collection Time: 11/26/17  4:13 PM  Result Value Ref Range   Color, UA     Clarity, UA     Glucose, UA Negative Negative   Bilirubin, UA     Ketones, UA neg    Spec Grav, UA     Blood, UA neg    pH, UA     POC Protein UA Negative Negative, Trace   Urobilinogen, UA     Nitrite, UA neg    Leukocytes, UA Trace (A) Negative   Appearance     Odor       Orders Placed This Encounter  Procedures  . POC Urinalysis Dipstick OB    Plan:  Continued routine obstetrical care,   Return in about 1 week (around 12/03/2017) for LROB, NST.

## 2017-11-26 NOTE — Patient Instructions (Signed)

## 2017-11-28 ENCOUNTER — Telehealth: Payer: Self-pay | Admitting: *Deleted

## 2017-11-28 NOTE — Telephone Encounter (Signed)
S.O states he spoke with Joni Reining through [facility] as she does not have a phone. States she is contracting every 3-4 minutes and they are getting closer and more uncomfortable.  Advised is she was uncomfortable and felt like they were getting stronger, then to go to [facility] for eval.  Verbalized understanding and stated he would let her know.

## 2017-11-30 ENCOUNTER — Encounter (HOSPITAL_COMMUNITY): Payer: Self-pay | Admitting: *Deleted

## 2017-11-30 ENCOUNTER — Inpatient Hospital Stay (HOSPITAL_COMMUNITY): Payer: Medicaid Other | Admitting: Anesthesiology

## 2017-11-30 ENCOUNTER — Inpatient Hospital Stay (HOSPITAL_COMMUNITY)
Admission: AD | Admit: 2017-11-30 | Discharge: 2017-12-02 | DRG: 807 | Disposition: A | Discharge status: Home / Self Care | Payer: Medicaid Other | Attending: Obstetrics & Gynecology | Admitting: Obstetrics & Gynecology

## 2017-11-30 ENCOUNTER — Other Ambulatory Visit: Payer: Self-pay

## 2017-11-30 DIAGNOSIS — Z3A39 39 weeks gestation of pregnancy: Secondary | ICD-10-CM

## 2017-11-30 DIAGNOSIS — O4202 Full-term premature rupture of membranes, onset of labor within 24 hours of rupture: Secondary | ICD-10-CM | POA: Diagnosis not present

## 2017-11-30 DIAGNOSIS — O4292 Full-term premature rupture of membranes, unspecified as to length of time between rupture and onset of labor: Principal | ICD-10-CM | POA: Diagnosis present

## 2017-11-30 LAB — CBC
HCT: 35 % — ABNORMAL LOW (ref 36.0–46.0)
Hemoglobin: 11.6 g/dL — ABNORMAL LOW (ref 12.0–15.0)
MCH: 26.8 pg (ref 26.0–34.0)
MCHC: 33.1 g/dL (ref 30.0–36.0)
MCV: 80.8 fL (ref 80.0–100.0)
Platelets: 234 10*3/uL (ref 150–400)
RBC: 4.33 MIL/uL (ref 3.87–5.11)
RDW: 16.1 % — ABNORMAL HIGH (ref 11.5–15.5)
WBC: 13.3 10*3/uL — ABNORMAL HIGH (ref 4.0–10.5)
nRBC: 0 % (ref 0.0–0.2)

## 2017-11-30 LAB — POCT FERN TEST: POCT Fern Test: POSITIVE

## 2017-11-30 MED ORDER — SENNOSIDES-DOCUSATE SODIUM 8.6-50 MG PO TABS
2.0000 | ORAL_TABLET | ORAL | Status: DC
  Administered 2017-12-02: 2 via ORAL
  Filled 2017-11-30: qty 2

## 2017-11-30 MED ORDER — LIDOCAINE HCL (PF) 1 % IJ SOLN
INTRAMUSCULAR | Status: DC | PRN
  Administered 2017-11-30: 13 mL via EPIDURAL

## 2017-11-30 MED ORDER — DIBUCAINE 1 % RE OINT: 1.0000 "application " | TOPICAL_OINTMENT | RECTAL | Status: DC | PRN

## 2017-11-30 MED ORDER — BENZOCAINE-MENTHOL 20-0.5 % EX AERO: 1.0000 "application " | INHALATION_SPRAY | CUTANEOUS | Status: DC | PRN

## 2017-11-30 MED ORDER — OXYTOCIN BOLUS FROM INFUSION
500.0000 mL | Freq: Once | INTRAVENOUS | Status: AC
  Administered 2017-11-30: 500 mL via INTRAVENOUS

## 2017-11-30 MED ORDER — FENTANYL CITRATE (PF) 100 MCG/2ML IJ SOLN: 50.0000 ug | INTRAMUSCULAR | Status: DC | PRN

## 2017-11-30 MED ORDER — ZOLPIDEM TARTRATE 5 MG PO TABS: 5.0000 mg | ORAL_TABLET | Freq: Every evening | ORAL | Status: DC | PRN

## 2017-11-30 MED ORDER — TETANUS-DIPHTH-ACELL PERTUSSIS 5-2.5-18.5 LF-MCG/0.5 IM SUSP: 0.5000 mL | Freq: Once | INTRAMUSCULAR | Status: DC

## 2017-11-30 MED ORDER — LIDOCAINE HCL (PF) 1 % IJ SOLN
30.0000 mL | INTRAMUSCULAR | Status: DC | PRN
  Filled 2017-11-30: qty 30

## 2017-11-30 MED ORDER — ONDANSETRON HCL 4 MG/2ML IJ SOLN: 4.0000 mg | INTRAMUSCULAR | Status: DC | PRN

## 2017-11-30 MED ORDER — EPHEDRINE 5 MG/ML INJ
10.0000 mg | INTRAVENOUS | Status: DC | PRN
  Filled 2017-11-30: qty 2

## 2017-11-30 MED ORDER — OXYCODONE-ACETAMINOPHEN 5-325 MG PO TABS: 1.0000 | ORAL_TABLET | ORAL | Status: DC | PRN

## 2017-11-30 MED ORDER — OXYCODONE-ACETAMINOPHEN 5-325 MG PO TABS: 2.0000 | ORAL_TABLET | ORAL | Status: DC | PRN

## 2017-11-30 MED ORDER — ACETAMINOPHEN 325 MG PO TABS: 650.0000 mg | ORAL_TABLET | ORAL | Status: DC | PRN

## 2017-11-30 MED ORDER — PRENATAL MULTIVITAMIN CH
1.0000 | ORAL_TABLET | Freq: Every day | ORAL | Status: DC
  Administered 2017-12-01 – 2017-12-02 (×2): 1 via ORAL
  Filled 2017-11-30 (×2): qty 1

## 2017-11-30 MED ORDER — ONDANSETRON HCL 4 MG/2ML IJ SOLN: 4.0000 mg | Freq: Four times a day (QID) | INTRAMUSCULAR | Status: DC | PRN

## 2017-11-30 MED ORDER — PHENYLEPHRINE 40 MCG/ML (10ML) SYRINGE FOR IV PUSH (FOR BLOOD PRESSURE SUPPORT)
80.0000 ug | PREFILLED_SYRINGE | INTRAVENOUS | Status: DC | PRN
  Filled 2017-11-30: qty 5

## 2017-11-30 MED ORDER — DIPHENHYDRAMINE HCL 50 MG/ML IJ SOLN: 12.5000 mg | INTRAMUSCULAR | Status: DC | PRN

## 2017-11-30 MED ORDER — LACTATED RINGERS IV SOLN: 500.0000 mL | INTRAVENOUS | Status: DC | PRN

## 2017-11-30 MED ORDER — WITCH HAZEL-GLYCERIN EX PADS: 1.0000 "application " | MEDICATED_PAD | CUTANEOUS | Status: DC | PRN

## 2017-11-30 MED ORDER — DIPHENHYDRAMINE HCL 25 MG PO CAPS: 25.0000 mg | ORAL_CAPSULE | Freq: Four times a day (QID) | ORAL | Status: DC | PRN

## 2017-11-30 MED ORDER — ACETAMINOPHEN 325 MG PO TABS
650.0000 mg | ORAL_TABLET | ORAL | Status: DC | PRN
  Filled 2017-11-30: qty 2

## 2017-11-30 MED ORDER — IBUPROFEN 600 MG PO TABS
600.0000 mg | ORAL_TABLET | Freq: Four times a day (QID) | ORAL | Status: DC
  Administered 2017-11-30 – 2017-12-02 (×6): 600 mg via ORAL
  Filled 2017-11-30 (×6): qty 1

## 2017-11-30 MED ORDER — OXYTOCIN 40 UNITS IN LACTATED RINGERS INFUSION - SIMPLE MED
2.5000 [IU]/h | INTRAVENOUS | Status: DC
  Filled 2017-11-30: qty 1000

## 2017-11-30 MED ORDER — FENTANYL 2.5 MCG/ML BUPIVACAINE 1/10 % EPIDURAL INFUSION (WH - ANES)
INTRAMUSCULAR | Status: AC
  Filled 2017-11-30: qty 100

## 2017-11-30 MED ORDER — MEASLES, MUMPS & RUBELLA VAC ~~LOC~~ INJ
0.5000 mL | INJECTION | Freq: Once | SUBCUTANEOUS | Status: DC
  Filled 2017-11-30: qty 0.5

## 2017-11-30 MED ORDER — FENTANYL 2.5 MCG/ML BUPIVACAINE 1/10 % EPIDURAL INFUSION (WH - ANES)
14.0000 mL/h | INTRAMUSCULAR | Status: DC | PRN
  Administered 2017-11-30: 14 mL/h via EPIDURAL

## 2017-11-30 MED ORDER — PHENYLEPHRINE 40 MCG/ML (10ML) SYRINGE FOR IV PUSH (FOR BLOOD PRESSURE SUPPORT)
PREFILLED_SYRINGE | INTRAVENOUS | Status: AC
  Filled 2017-11-30: qty 20

## 2017-11-30 MED ORDER — LACTATED RINGERS IV SOLN: 500.0000 mL | Freq: Once | INTRAVENOUS | Status: DC

## 2017-11-30 MED ORDER — COCONUT OIL OIL: 1.0000 "application " | TOPICAL_OIL | Status: DC | PRN

## 2017-11-30 MED ORDER — SIMETHICONE 80 MG PO CHEW: 80.0000 mg | CHEWABLE_TABLET | ORAL | Status: DC | PRN

## 2017-11-30 MED ORDER — SOD CITRATE-CITRIC ACID 500-334 MG/5ML PO SOLN: 30.0000 mL | ORAL | Status: DC | PRN

## 2017-11-30 MED ORDER — LACTATED RINGERS IV SOLN
INTRAVENOUS | Status: DC
  Administered 2017-11-30 (×2): via INTRAVENOUS

## 2017-11-30 MED ORDER — ONDANSETRON HCL 4 MG PO TABS: 4.0000 mg | ORAL_TABLET | ORAL | Status: DC | PRN

## 2017-11-30 NOTE — Anesthesia Preprocedure Evaluation (Signed)
Anesthesia Evaluation  Patient identified by MRN, date of birth, ID band Patient awake    Reviewed: Allergy & Precautions, H&P , NPO status , Patient's Chart, lab work & pertinent test results  Airway Mallampati: I  TM Distance: >3 FB Neck ROM: full    Dental no notable dental hx.    Pulmonary neg pulmonary ROS,    Pulmonary exam normal breath sounds clear to auscultation       Cardiovascular negative cardio ROS Normal cardiovascular exam Rhythm:Regular Rate:Normal     Neuro/Psych    GI/Hepatic negative GI ROS, Neg liver ROS,   Endo/Other  negative endocrine ROS  Renal/GU negative Renal ROS     Musculoskeletal   Abdominal Normal abdominal exam  (+)   Peds  Hematology negative hematology ROS (+)   Anesthesia Other Findings   Reproductive/Obstetrics (+) Pregnancy                             Anesthesia Physical  Anesthesia Plan  ASA: II  Anesthesia Plan: Epidural   Post-op Pain Management:    Induction:   PONV Risk Score and Plan:   Airway Management Planned:   Additional Equipment:   Intra-op Plan:   Post-operative Plan:   Informed Consent: I have reviewed the patients History and Physical, chart, labs and discussed the procedure including the risks, benefits and alternatives for the proposed anesthesia with the patient or authorized representative who has indicated his/her understanding and acceptance.     Plan Discussed with:   Anesthesia Plan Comments:         Anesthesia Quick Evaluation

## 2017-11-30 NOTE — Anesthesia Procedure Notes (Signed)
Epidural Patient location during procedure: OB Start time: 11/30/2017 5:10 PM End time: 11/30/2017 5:22 PM  Staffing Anesthesiologist: Lowella Curb, MD Performed: anesthesiologist   Preanesthetic Checklist Completed: patient identified, site marked, surgical consent, pre-op evaluation, timeout performed, IV checked, risks and benefits discussed and monitors and equipment checked  Epidural Patient position: sitting Prep: ChloraPrep Patient monitoring: heart rate, cardiac monitor, continuous pulse ox and blood pressure Approach: midline Location: L2-L3 Injection technique: LOR saline  Needle:  Needle type: Tuohy  Needle gauge: 17 G Needle length: 9 cm Needle insertion depth: 4 cm Catheter type: closed end flexible Catheter size: 20 Guage Catheter at skin depth: 8 cm Test dose: negative  Assessment Events: blood not aspirated, injection not painful, no injection resistance, negative IV test and no paresthesia  Additional Notes Reason for block:procedure for pain

## 2017-11-30 NOTE — H&P (Addendum)
LABOR AND DELIVERY ADMISSION HISTORY AND PHYSICAL NOTE  Rebekah [name] is a 27 y.o. female G2P1001 with IUP at [redacted]w[redacted]d by 11wk ultrasound presenting for PROM.  She reports positive fetal movement. She denies leakage of fluid or vaginal bleeding.  Presented with contractions that started around noon.  Upon arrival to MAU spontaneously ruptured meconium-stained fluid.    Prenatal History/Complications: PNC at [facility] Pregnancy complications:  - Abnormal Pap with normal colpo - Anxiety/depression -VAVD first delivery for OP presentation   Past Medical History: Past Medical History:  Diagnosis Date  . Bartholin's cyst   . Chlamydia   . Depression   . Eczema   . Headache   . Vaginal Pap smear, abnormal     Past Surgical History: Past Surgical History:  Procedure Laterality Date  . WISDOM TOOTH EXTRACTION      Obstetrical History: OB History    Gravida  2   Para  1   Term  1   Preterm  0   AB  0   Living  1     SAB  0   TAB  0   Ectopic  0   Multiple  0   Live Births  1           Social History: Social History   Socioeconomic History  . Marital status: Single    Spouse name: Not on file  . Number of children: Not on file  . Years of education: Not on file  . Highest education level: Not on file  Occupational History  . Not on file  Social Needs  . Financial resource strain: Not on file  . Food insecurity:    Worry: Not on file    Inability: Not on file  . Transportation needs:    Medical: Not on file    Non-medical: Not on file  Tobacco Use  . Smoking status: Never Smoker  . Smokeless tobacco: Never Used  Substance and Sexual Activity  . Alcohol use: No  . Drug use: Not Currently    Types: Marijuana    Comment: 3 weeks ago  . Sexual activity: Yes    Birth control/protection: None  Lifestyle  . Physical activity:    Days per week: Not on file    Minutes per session: Not on file  . Stress: Not on file  Relationships  .  Social connections:    Talks on phone: Not on file    Gets together: Not on file    Attends religious service: Not on file    Active member of club or organization: Not on file    Attends meetings of clubs or organizations: Not on file    Relationship status: Not on file  Other Topics Concern  . Not on file  Social History Narrative  . Not on file    Family History: Family History  Problem Relation Age of Onset  . Cancer Mother        lung    Allergies: No Known Allergies  Medications Prior to Admission  Medication Sig Dispense Refill Last Dose  . flintstones complete (FLINTSTONES) 60 MG chewable tablet Take 2 daily 100 tablet 12 Taking  . ondansetron (ZOFRAN-ODT) 4 MG disintegrating tablet Take 1 tablet (4 mg total) by mouth every 6 (six) hours as needed for nausea or vomiting. 30 tablet 1 Taking     Review of Systems  All systems reviewed and negative except as stated in HPI  Physical Exam Blood pressure 126/75,  pulse 90, temperature 98 F (36.7 C), temperature source Oral, resp. rate 16, weight 62.6 kg, last menstrual period 03/10/2017, not currently breastfeeding. General appearance: alert, oriented, NAD Lungs: normal respiratory effort Heart: regular rate Abdomen: soft, non-tender; gravid, FH appropriate for GA Extremities: No calf swelling or tenderness Presentation: cephalic Fetal monitoring: Cat 1 Uterine activity: Regular CTX q3-85min Dilation: 4 Effacement (%): 90 Station: -2 Exam by:: Dorrene German RN  Prenatal labs: ABO, Rh: B/Negative/-- (04/24 1231) Antibody: Negative (08/14 0908) Rubella: 1.03 (04/24 1231) RPR: Non Reactive (08/14 0908)  HBsAg: Negative (04/24 1231)  HIV: Non Reactive (08/14 0908)  GC/Chlamydia: Negative GBS:   Negative 2-hr GTT: Normal Genetic screening:  Negative Anatomy US: Normal girl  Prenatal Transfer Tool  Maternal Diabetes: No Genetic Screening: Normal Maternal Ultrasounds/Referrals: Normal Fetal Ultrasounds or other  Referrals:  None Maternal Substance Abuse:  No Significant Maternal Medications:  None Significant Maternal Lab Results: None  Results for orders placed or performed during the hospital encounter of 11/30/17 (from the past 24 hour(s))  POCT fern test   Collection Time: 11/30/17  4:13 PM  Result Value Ref Range   POCT Fern Test Positive = ruptured amniotic membanes   CBC   Collection Time: 11/30/17  4:35 PM  Result Value Ref Range   WBC 13.3 (H) 4.0 - 10.5 K/uL   RBC 4.33 3.87 - 5.11 MIL/uL   Hemoglobin 11.6 (L) 12.0 - 15.0 g/dL   HCT 08.6 (L) 57.8 - 46.9 %   MCV 80.8 80.0 - 100.0 fL   MCH 26.8 26.0 - 34.0 pg   MCHC 33.1 30.0 - 36.0 g/dL   RDW 62.9 (H) 52.8 - 41.3 %   Platelets 234 150 - 400 K/uL   nRBC 0.0 0.0 - 0.2 %    Patient Active Problem List   Diagnosis Date Noted  . Normal labor and delivery 11/30/2017  . 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 Supervision of normal pregnancy 05/28/2017  . Bartholin's cyst   . Eczema   . Social anxiety disorder 03/25/2013  . Depression 03/25/2013    Assessment: Melaney Tellefsen is a 27 y.o. G2P1001 at [redacted]w[redacted]d here for PROM  #Labor: PROM, Augment w/ Pitocin as needed #Pain: Epidural in place #FWB: 7lb - [redacted]w[redacted]d ultrasound EFW 33%, Cat I strop  #ID:  GBS(-) #MOF: Breast #MOC:POCPs #Circ:  NA  #PROM - Limit cervical checks - If febrile, start gent+clinda  Terisa Starr, MD 11/30/2017, 5:03 PM   OB FELLOW HISTORY AND PHYSICAL ATTESTATION  I have seen and examined this patient; I agree with above documentation in the resident's note.   Marcy Siren, D.O. OB Fellow  11/30/2017, 6:01 PM

## 2017-11-30 NOTE — Plan of Care (Signed)
  Problem: Education: Goal: Knowledge of condition will improve Outcome: Progressing   Problem: Activity: Goal: Will verbalize the importance of balancing activity with adequate rest periods Outcome: Progressing Goal: Ability to tolerate increased activity will improve Outcome: Progressing   Problem: Coping: Goal: Ability to identify and utilize available resources and services will improve Outcome: Progressing   Problem: Role Relationship: Goal: Ability to demonstrate positive interaction with newborn will improve Outcome: Progressing   

## 2017-11-30 NOTE — MAU Note (Signed)
Pt states she felt fluid coming out, meconium stained fluid noted on pad.

## 2017-11-30 NOTE — MAU Note (Signed)
Pt C/O contractions since around 1200, denies bleeding or LOF.  Reports good fetal movement.

## 2017-12-01 ENCOUNTER — Telehealth: Payer: Self-pay | Admitting: *Deleted

## 2017-12-01 MED ORDER — RHO D IMMUNE GLOBULIN 1500 UNIT/2ML IJ SOSY
300.0000 ug | PREFILLED_SYRINGE | Freq: Once | INTRAMUSCULAR | Status: AC
  Administered 2017-12-01: 300 ug via INTRAVENOUS
  Filled 2017-12-01: qty 2

## 2017-12-01 NOTE — Progress Notes (Addendum)
POSTPARTUM PROGRESS NOTE  Post Partum Day 1  Subjective:  Rebekah [name] is a 27 y.o. G2P2002 at [redacted]w[redacted]d.  She reports she is doing well. No acute events overnight. She denies any problems with ambulating, voiding or po intake. She has not yet had a bowel movement but she is passing gas. Denies HA, dizziness, shortness of breath, and calf pain. Denies nausea or vomiting.  Pain is well controlled.  Lochia is moderate.  Objective: Blood pressure 99/70, pulse 81, temperature 97.9 F (36.6 C), temperature source Oral, resp. rate 16, weight 62.6 kg, last menstrual period 03/10/2017, SpO2 99 %, unknown if currently breastfeeding.  Physical Exam:  General: alert, cooperative and no distress Chest: no respiratory distress Heart:regular rate, distal pulses intact Abdomen: soft, nontender Uterine Fundus: firm, appropriately tender DVT Evaluation: No calf swelling or tenderness Extremities: no edema Skin: warm, dry  Recent Labs    11/30/17 1635  HGB 11.6*  HCT 35.0*    Assessment/Plan: Rebekah [name] is a 27 y.o. G2P2002 at [redacted]w[redacted]d   PPD#1 - Doing well  Routine postpartum care  Contraception: oral contraceptives Feeding: breastfeeding Dispo: Plan for discharge tomorrow.   LOS: 1 day   Avie Arenas, PA-S 12/01/2017, 7:31 AM   I confirm that I have verified the information documented in the PA student's note and that I have also personally performed the physical exam and all medical decision making activities.  Clayton Bibles, CNM 12/01/17  8:03 AM

## 2017-12-01 NOTE — Anesthesia Postprocedure Evaluation (Signed)
Anesthesia Post Note  Patient: Rebekah [name]  Procedure(s) Performed: AN AD HOC LABOR EPIDURAL     Patient location during evaluation: Mother Baby Anesthesia Type: Epidural Level of consciousness: awake, awake and alert and oriented Pain management: pain level controlled Vital Signs Assessment: post-procedure vital signs reviewed and stable Respiratory status: spontaneous breathing and respiratory function stable Cardiovascular status: blood pressure returned to baseline Postop Assessment: no headache, epidural receding, patient able to bend at knees, adequate PO intake, no backache, no apparent nausea or vomiting and able to ambulate Anesthetic complications: no    Last Vitals:  Vitals:   12/01/17 0248 12/01/17 0617  BP: 98/67 99/70  Pulse: 71 81  Resp: 16 16  Temp: 36.7 C 36.6 C  SpO2: 99%     Last Pain:  Vitals:   12/01/17 0710  TempSrc:   PainSc: 0-No pain   Pain Goal: Patients Stated Pain Goal: 0 (12/01/17 0621)               Cleda Clarks

## 2017-12-01 NOTE — Progress Notes (Signed)
CLINICAL SOCIAL WORK MATERNAL/CHILD NOTE  Patient Details  Name: Rebekah [name] MRN: [id number] Date of Birth: 11/30/2016  Date:  12/01/2017  Clinical Social Worker Initiating Note:  t Date/Time: Initiated:  12/01/17/1445     Child's Name:  Rebekah [name]    Biological Parents:  Mother, Father   Need for Interpreter:  None   Reason for Referral:  Current Substance Use/Substance Use During Pregnancy    Address:  [address] [place] [address]    Phone number:  [phone] (home)     Additional phone number: N/a  Household Members/Support Persons (HM/SP):   Household Member/Support Person 1, Household Member/Support Person 2   HM/SP Name Relationship DOB or Age  HM/SP -68 Rebekah [name]  Boyfriend     HM/SP -2 Rebekah [name]  Daughter  2  HM/SP -3        HM/SP -4        HM/SP -5        HM/SP -6        HM/SP -7        HM/SP -8          Natural Supports (not living in the home):  Extended Family   Professional Supports:     Employment: Unemployed   Type of Work:     Education:      Homebound arranged:    Museum/gallery curator Resources:  Medicaid   Other Resources:      Cultural/Religious Considerations Which May Impact Care:  N/a  Strengths:  Ability to meet basic needs    Psychotropic Medications:         Pediatrician:       Pediatrician List:   [place]      Pediatrician Fax Number:    Risk Factors/Current Problems:  None, Other (Comment)(No pediatrican choosen )   Cognitive State:  Alert    Mood/Affect:  Anxious , Tearful , Calm    CSW Assessment: CSW met with MOB via bedside to provide any supports needed and inform her of CPS report being completed due to positive UDS. MOB voiced having an easy delivery with no complications and feeling ready to leaving the hospital. This is MOB's second child- she has a 76 year old names Rebekah [name] and new baby will be named  Rebekah [name]. MOB currently lives with boyfriend Rebekah [name] and both children. MOB voiced having some anxiety but it "being better than it used to be". MOB was open with CSW regarding having severe anxiety in the past that prevented her from leaving the house. MOB stated she used to take a prescription for her anxiety and went to counseling however no longer takes medication/ goes to therapy. MOB states she was doing much better on medication and then thought she no longer needed it. MOB did not voice wanting to start on medication at this time. MOB does voice having some anxiety at this time but states she is able to manage it.   MOB voiced having many supports including boyfriend and his family who live across the street. MOB is currently unemployed and states this is a stressor for her however boyfriend is supportive and wants her to stay at home with children. MOB is looking forward to being able to go back to work at some point in the future. MOB states their household is prepared for the new baby and have a crib  and car seat. MOB does not have a pediatrician picked out at this time. CSW informed MOB of CPS report being completed due to positive UDS- MOB was appropriately tearful and asked questions regarding process.   CSW completed CPS report with [place]. Will continue to monitor Cord Drug Screen.      CSW Plan/Description:  No Further Intervention Required/No Barriers to Discharge, CSW Will Continue to Monitor Umbilical Cord Tissue Drug Screen Results and Make Report if [place], Child Protective Service Report     Weston Anna, LCSW 12/01/2017, 3:31 PM

## 2017-12-01 NOTE — Lactation Note (Signed)
This note was copied from a baby's chart. Lactation Consultation Note  Patient Name: Rebekah [name] ZOXWR'U Date: 12/01/2017 Reason for consult: Initial assessment;Term  Visited with P2 Mom of term baby at 66 hrs old.  Mom states baby has been latching well, but recently has been sleepy.  Baby sleeping in her sleeper in the crib.  Recommended she undress baby and put her STS on her chest.  Basics reviewed with breastfeeding, Mom bf her now 26 yr old for a year.  Baby began cuing, so offered to assist with positioning and latching.  Baby positioned in football hold on left breast.  Baby latched a couple times and sucked sleepily.  Observed lower jaw slipping up and baby sucking onto nipple.  Mom took baby off, and showed her how to sandwich breast more and wait for a wider gape of mouth.  Baby latched deeply, and swallowing identified.  Mom reports a deeper pull.   Reviewed importance of keeping baby STS and feeding often when she cues.  Reminded her of normal cluster feeding on 2nd night of life.  Encouraged her to indulge baby with frequent feedings.   Lactation brochure given with information about IP and OP lactation support available to her.    Maternal Data Formula Feeding for Exclusion: No Has patient been taught Hand Expression?: Yes Does the patient have breastfeeding experience prior to this delivery?: Yes  Feeding Feeding Type: Breast Fed  LATCH Score Latch: Grasps breast easily, tongue down, lips flanged, rhythmical sucking.  Audible Swallowing: A few with stimulation  Type of Nipple: Everted at rest and after stimulation  Comfort (Breast/Nipple): Soft / non-tender  Hold (Positioning): Assistance needed to correctly position infant at breast and maintain latch.  LATCH Score: 8  Interventions Interventions: Breast feeding basics reviewed;Assisted with latch;Skin to skin;Breast massage;Hand express;Reverse pressure;Breast compression;Adjust position;Support  pillows;Position options  Consult Status Consult Status: Follow-up Date: 12/02/17    Judee Clara 12/01/2017, 2:22 PM

## 2017-12-01 NOTE — Telephone Encounter (Signed)
Left message with man for pt to call us back to set up pp appointment.  12-01-17  AS

## 2017-12-02 LAB — TYPE AND SCREEN
ABO/RH(D): B NEG
Antibody Screen: POSITIVE
UNIT DIVISION: 0
Unit division: 0

## 2017-12-02 LAB — RPR: RPR: NONREACTIVE

## 2017-12-02 LAB — RH IG WORKUP (INCLUDES ABO/RH)
ABO/RH(D): B NEG
FETAL SCREEN: NEGATIVE
Gestational Age(Wks): 39.4
UNIT DIVISION: 0

## 2017-12-02 LAB — BPAM RBC
Blood Product Expiration Date: 201911262359
Blood Product Expiration Date: 201911272359
Unit Type and Rh: 1700
Unit Type and Rh: 1700

## 2017-12-02 MED ORDER — IBUPROFEN 600 MG PO TABS: 600.0000 mg | ORAL_TABLET | Freq: Four times a day (QID) | ORAL | 0 refills | Status: DC

## 2017-12-02 MED ORDER — NORETHINDRONE 0.35 MG PO TABS: 1.0000 | ORAL_TABLET | Freq: Every day | ORAL | 11 refills | Status: DC

## 2017-12-02 MED ORDER — SENNOSIDES-DOCUSATE SODIUM 8.6-50 MG PO TABS: 2.0000 | ORAL_TABLET | ORAL | 0 refills | Status: DC

## 2017-12-02 NOTE — Lactation Note (Signed)
This note was copied from a baby's chart. Lactation Consultation Note  Patient Name: Rebekah [name] ZOXWR'U Date: 12/02/2017 Reason for consult: Follow-up assessment  Visited with P2 Mom of term infant on day of discharge, baby 5% weight loss.     Mom states baby is latching well, and fed often during the night.  Mom states breasts are filling.  Engorgement prevention and treatment reviewed. Mom informed of importance of STS and feeding often on cue. Mom aware of OP lactation support and encouraged to call prn.    Consult Status Consult Status: Complete Date: 12/02/17 Follow-up type: Call as needed    Judee Clara 12/02/2017, 9:17 AM

## 2017-12-02 NOTE — Discharge Summary (Addendum)
OB Discharge Summary     Patient Name: Rebekah [name] DOB: 1990/08/30 MRN: [id number]  Date of admission: 11/30/2017 Delivering MD: Terisa Starr E   Date of discharge: 12/02/2017  Admitting diagnosis: 39 WKS, LABOR CHECK Intrauterine pregnancy: [redacted]w[redacted]d     Secondary diagnosis:  Active Problems:   Normal labor and delivery  Additional problems: History of anxiety and depression, no desire for medication at this time. History of vacuum assisted on first delivery for OP presentation.      Discharge diagnosis: Term Pregnancy Delivered                                                                    Post partum procedures:rhogam Augmentation: None  Complications: None  Hospital course:  Onset of Labor With Vaginal Delivery     27 y.o. yo [id number] at [redacted]w[redacted]d was admitted in Latent Labor on 11/30/2017. Patient had an uncomplicated labor course as follows:  Membrane Rupture Time/Date: 4:02 PM ,11/30/2017   Intrapartum Procedures: Episiotomy: None [1]                                         Lacerations:  None [1]  Patient had a delivery of a Viable infant. 11/30/2017  Information for the patient's newborn:  Errico, Girl Anatasia [[id number]]  Delivery Method: Vaginal, Spontaneous(Filed from Delivery Summary)   Pateint had an uncomplicated postpartum course.  She is ambulating, tolerating a regular diet, passing flatus, and urinating well. Patient is discharged home in stable condition on 12/02/17.   Physical exam  Vitals:   12/01/17 0617 12/01/17 1021 12/01/17 2300 12/02/17 0526  BP: 99/70 129/90 116/68 111/73  Pulse: 81 80 70 79  Resp: 16 17 16 18   Temp: 97.9 F (36.6 C) (!) 97.5 F (36.4 C) 97.7 F (36.5 C) 97.6 F (36.4 C)  TempSrc: Oral  Oral Oral  SpO2:   99%   Weight:       General: alert, cooperative and no distress Lochia: appropriate Uterine Fundus: firm Incision: N/A DVT Evaluation: No evidence of DVT seen on physical exam.No significant calf/ankle  edema.  Labs: Lab Results  Component Value Date   WBC 13.3 (H) 11/30/2017   HGB 11.6 (L) 11/30/2017   HCT 35.0 (L) 11/30/2017   MCV 80.8 11/30/2017   PLT 234 11/30/2017   No flowsheet data found.  Discharge instruction: per After Visit Summary and "Baby and Me Booklet".  After visit meds:  Allergies as of 12/02/2017   No Known Allergies     Medication List    STOP taking these medications   ondansetron 4 MG disintegrating tablet Commonly known as:  ZOFRAN-ODT     TAKE these medications   flintstones complete 60 MG chewable tablet Take 2 daily   ibuprofen 600 MG tablet Commonly known as:  ADVIL,MOTRIN Take 1 tablet (600 mg total) by mouth every 6 (six) hours.   norethindrone 0.35 MG tablet Commonly known as:  MICRONOR,CAMILA,ERRIN Take 1 tablet (0.35 mg total) by mouth daily. Start taking on:  12/16/2017   senna-docusate 8.6-50 MG tablet Commonly known as:  Senokot-S Take 2 tablets by mouth daily. Start taking on:  12/03/2017       Diet: routine diet Activity: Advance as tolerated. Pelvic rest for 6 weeks.  Outpatient follow up:4 weeks, Will set up a 2 week mood evaluation.   Follow up Appt: Future Appointments  Date Time Provider Department Center  01/08/2018  4:00 PM Cresenzo-Dishmon, Scarlette Calico, CNM [facility]   Postpartum contraception: Progesterone only pills  Newborn Data: Live born female  Birth Weight: 6 lb 10.2 oz (3011 g) APGAR: 8, 9  Newborn Delivery   Birth date/time:  11/30/2017 19:33:00 Delivery type:  Vaginal, Spontaneous    Baby Feeding: Breast Disposition:home with mother  12/02/2017 Allayne Stack, DO  Attestation: I have seen this patient and agree with the resident's documentation. I have examined them separately, and we have discussed the plan of care.  Cristal Deer. Earlene Plater, DO OB/GYN Fellow

## 2017-12-02 NOTE — Progress Notes (Signed)
Patient refused meds at this time and requested a few hours of sleep.  Elvie Palomo L Dailynn Nancarrow, RN

## 2017-12-03 ENCOUNTER — Other Ambulatory Visit: Payer: Medicaid Other | Admitting: Women's Health

## 2018-01-08 ENCOUNTER — Encounter: Payer: Self-pay | Admitting: *Deleted

## 2018-01-08 ENCOUNTER — Ambulatory Visit: Payer: Medicaid Other | Admitting: Advanced Practice Midwife

## 2018-02-02 ENCOUNTER — Emergency Department (HOSPITAL_COMMUNITY)
Admission: EM | Admit: 2018-02-02 | Discharge: 2018-02-02 | Disposition: A | Discharge status: Home / Self Care | Payer: Medicaid Other | Attending: Emergency Medicine | Admitting: Emergency Medicine

## 2018-02-02 ENCOUNTER — Other Ambulatory Visit: Payer: Self-pay

## 2018-02-02 ENCOUNTER — Encounter (HOSPITAL_COMMUNITY): Payer: Self-pay

## 2018-02-02 DIAGNOSIS — R112 Nausea with vomiting, unspecified: Secondary | ICD-10-CM

## 2018-02-02 DIAGNOSIS — R197 Diarrhea, unspecified: Secondary | ICD-10-CM

## 2018-02-02 DIAGNOSIS — Z79899 Other long term (current) drug therapy: Secondary | ICD-10-CM | POA: Insufficient documentation

## 2018-02-02 LAB — CBC WITH DIFFERENTIAL/PLATELET
Abs Immature Granulocytes: 0.02 10*3/uL (ref 0.00–0.07)
Basophils Absolute: 0.1 10*3/uL (ref 0.0–0.1)
Basophils Relative: 1 %
Eosinophils Absolute: 0.3 10*3/uL (ref 0.0–0.5)
Eosinophils Relative: 4 %
HCT: 43.7 % (ref 36.0–46.0)
Hemoglobin: 14.1 g/dL (ref 12.0–15.0)
Immature Granulocytes: 0 %
Lymphocytes Relative: 10 %
Lymphs Abs: 0.8 10*3/uL (ref 0.7–4.0)
MCH: 27 pg (ref 26.0–34.0)
MCHC: 32.3 g/dL (ref 30.0–36.0)
MCV: 83.6 fL (ref 80.0–100.0)
Monocytes Absolute: 0.6 10*3/uL (ref 0.1–1.0)
Monocytes Relative: 8 %
Neutro Abs: 5.7 10*3/uL (ref 1.7–7.7)
Neutrophils Relative %: 77 %
Platelets: 257 10*3/uL (ref 150–400)
RBC: 5.23 MIL/uL — ABNORMAL HIGH (ref 3.87–5.11)
RDW: 16.9 % — ABNORMAL HIGH (ref 11.5–15.5)
WBC: 7.4 10*3/uL (ref 4.0–10.5)
nRBC: 0 % (ref 0.0–0.2)

## 2018-02-02 LAB — URINALYSIS, ROUTINE W REFLEX MICROSCOPIC
Bilirubin Urine: NEGATIVE
Glucose, UA: NEGATIVE mg/dL
Hgb urine dipstick: NEGATIVE
Ketones, ur: 80 mg/dL — AB
Nitrite: NEGATIVE
Protein, ur: NEGATIVE mg/dL
Specific Gravity, Urine: 1.026 (ref 1.005–1.030)
pH: 5 (ref 5.0–8.0)

## 2018-02-02 LAB — COMPREHENSIVE METABOLIC PANEL
ALT: 31 U/L (ref 0–44)
AST: 26 U/L (ref 15–41)
Albumin: 5 g/dL (ref 3.5–5.0)
Alkaline Phosphatase: 80 U/L (ref 38–126)
Anion gap: 13 (ref 5–15)
BUN: 14 mg/dL (ref 6–20)
CO2: 22 mmol/L (ref 22–32)
Calcium: 9.4 mg/dL (ref 8.9–10.3)
Chloride: 105 mmol/L (ref 98–111)
Creatinine, Ser: 0.82 mg/dL (ref 0.44–1.00)
GFR calc Af Amer: >60 mL/min (ref >60)
GFR calc non Af Amer: >60 mL/min (ref >60)
Glucose, Bld: 88 mg/dL (ref 70–99)
Potassium: 3.5 mmol/L (ref 3.5–5.1)
Sodium: 140 mmol/L (ref 135–145)
Total Bilirubin: 1.2 mg/dL (ref 0.3–1.2)
Total Protein: 8 g/dL (ref 6.5–8.1)

## 2018-02-02 LAB — PREGNANCY, URINE: Preg Test, Ur: NEGATIVE

## 2018-02-02 MED ORDER — LACTATED RINGERS IV BOLUS
1000.0000 mL | Freq: Once | INTRAVENOUS | Status: AC
  Administered 2018-02-02: 1000 mL via INTRAVENOUS

## 2018-02-02 MED ORDER — PROMETHAZINE HCL 25 MG/ML IJ SOLN
12.5000 mg | Freq: Once | INTRAMUSCULAR | Status: AC
  Administered 2018-02-02: 12.5 mg via INTRAVENOUS
  Filled 2018-02-02: qty 1

## 2018-02-02 MED ORDER — LOPERAMIDE HCL 2 MG PO CAPS
4.0000 mg | ORAL_CAPSULE | Freq: Once | ORAL | Status: AC
  Administered 2018-02-02: 4 mg via ORAL
  Filled 2018-02-02: qty 2

## 2018-02-02 MED ORDER — ONDANSETRON HCL 4 MG PO TABS: 4.0000 mg | ORAL_TABLET | Freq: Four times a day (QID) | ORAL | 0 refills | Status: DC

## 2018-02-02 NOTE — ED Triage Notes (Signed)
Pt reports n/v/diarrhea that started last week.  Reports had a cold prior to the diarrhea.  Reports abd pain when she eats anything.   Reports hadn't had a bm in 3 days then had diarrhea again this morning and started vomiting again.

## 2018-02-14 NOTE — ED Provider Notes (Signed)
[facility]ERGENCY DEPARTMENT Provider Note   CSN: [id number] Arrival date & time: 02/02/18  1507     History   Chief Complaint Chief Complaint  Patient presents with  . Nausea    HPI Rebekah Songicole [name] is a 28 y.o. female.  HPI   28 year old female with nausea, vomiting and diarrhea.  Began feeling poorly last week.  Intermittent symptoms since then.  Crampy abdominal pain after she eats anything.  Does not particularly localize.  No fevers or chills.  No acute urinary complaints.  Denies any sick contacts.  Past Medical History:  Diagnosis Date  . Bartholin's cyst   . Chlamydia   . Depression   . Eczema   . Headache   . Vaginal Pap smear, abnormal     Patient Active Problem List   Diagnosis Date Noted  . Normal labor and delivery 11/30/2017  . 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 Supervision of normal pregnancy 05/28/2017  . Bartholin's cyst   . Eczema   . Social anxiety disorder 03/25/2013  . Depression 03/25/2013    Past Surgical History:  Procedure Laterality Date  . WISDOM TOOTH EXTRACTION       OB History    Gravida  2   Para  2   Term  2   Preterm  0   AB  0   Living  2     SAB  0   TAB  0   Ectopic  0   Multiple  0   Live Births  2            Home Medications    Prior to Admission medications   Medication Sig Start Date End Date Taking? Authorizing Provider  acetaminophen (TYLENOL) 500 MG tablet Take 500 mg by mouth every 6 (six) hours as needed for mild pain.   Yes [provider]  norethindrone (ORTHO MICRONOR) 0.35 MG tablet Take 1 tablet (0.35 mg total) by mouth daily. 12/16/17 12/16/18 Yes Beard, Janace LittenSamantha N, DO  Ondansetron (ZOFRAN ODT PO) Take 1 tablet by mouth once as needed (for nausea/vomiting).   Yes [provider]  ondansetron (ZOFRAN) 4 MG tablet Take 1 tablet (4 mg total) by mouth every 6 (six) hours. 02/02/18   Raeford RazorKohut, Ardys Hataway, MD    Family History Family History  Problem  Relation Age of Onset  . Cancer Mother        lung    Social History Social History   Tobacco Use  . Smoking status: Never Smoker  . Smokeless tobacco: Never Used  Substance Use Topics  . Alcohol use: No  . Drug use: Not Currently    Types: Marijuana     Allergies   Patient has no known allergies.   Review of Systems Review of Systems  All systems reviewed and negative, other than as noted in HPI.  Physical Exam Updated Vital Signs BP 122/70 (BP Location: Right Arm)   Pulse 82   Temp 98 F (36.7 C) (Oral)   Resp 18   Ht 5\' 3"  (1.6 m)   Wt 50.3 kg   SpO2 98%   Breastfeeding Yes   BMI 19.66 kg/m   Physical Exam Vitals signs and nursing note reviewed.  Constitutional:      General: She is not in acute distress.    Appearance: She is well-developed.  HENT:     Head: Normocephalic and atraumatic.  Eyes:     General:  Right eye: No discharge.        Left eye: No discharge.     Conjunctiva/sclera: Conjunctivae normal.  Neck:     Musculoskeletal: Neck supple.  Cardiovascular:     Rate and Rhythm: Normal rate and regular rhythm.     Heart sounds: Normal heart sounds. No murmur. No friction rub. No gallop.   Pulmonary:     Effort: Pulmonary effort is normal. No respiratory distress.     Breath sounds: Normal breath sounds.  Abdominal:     General: There is no distension.     Palpations: Abdomen is soft.     Tenderness: There is no abdominal tenderness.  Musculoskeletal:        General: No tenderness.  Skin:    General: Skin is warm and dry.  Neurological:     Mental Status: She is alert.  Psychiatric:        Behavior: Behavior normal.        Thought Content: Thought content normal.      ED Treatments / Results  Labs (all labs ordered are listed, but only abnormal results are displayed) Labs Reviewed  URINALYSIS, ROUTINE W REFLEX MICROSCOPIC - Abnormal; Notable for the following components:      Result Value   APPearance HAZY (*)     Ketones, ur 80 (*)    Leukocytes, UA TRACE (*)    Bacteria, UA RARE (*)    All other components within normal limits  CBC WITH DIFFERENTIAL/PLATELET - Abnormal; Notable for the following components:   RBC 5.23 (*)    RDW 16.9 (*)    All other components within normal limits  PREGNANCY, URINE  COMPREHENSIVE METABOLIC PANEL    EKG None  Radiology No results found.   Procedures Procedures (including critical care time)  Medications Ordered in ED Medications  lactated ringers bolus 1,000 mL (0 mLs Intravenous Stopped 02/02/18 2011)  promethazine (PHENERGAN) injection 12.5 mg (12.5 mg Intravenous Given 02/02/18 1850)  loperamide (IMODIUM) capsule 4 mg (4 mg Oral Given 02/02/18 1857)     Initial Impression / Assessment and Plan / ED Course  I have reviewed the triage vital signs and the nursing notes.  Pertinent labs & imaging results that were available during my care of the patient were reviewed by me and considered in my medical decision making (see chart for details).     28 year old female with nausea, vomiting and diarrhea.  Likely viral illness.  Abdominal exam reassuring.  Treated symptomatically with improvement.  I doubt emergent process.  Final Clinical Impressions(s) / ED Diagnoses   Final diagnoses:  Nausea vomiting and diarrhea    ED Discharge Orders         Ordered    ondansetron (ZOFRAN) 4 MG tablet  Every 6 hours     02/02/18 2015           Raeford Razor, MD 02/14/18 0121

## 2018-04-22 ENCOUNTER — Emergency Department (HOSPITAL_COMMUNITY)
Admission: EM | Admit: 2018-04-22 | Discharge: 2018-04-22 | Disposition: A | Discharge status: Home / Self Care | Payer: Medicaid Other | Attending: Emergency Medicine | Admitting: Emergency Medicine

## 2018-04-22 ENCOUNTER — Encounter (HOSPITAL_COMMUNITY): Payer: Self-pay | Admitting: Emergency Medicine

## 2018-04-22 ENCOUNTER — Other Ambulatory Visit: Payer: Self-pay

## 2018-04-22 DIAGNOSIS — G43011 Migraine without aura, intractable, with status migrainosus: Secondary | ICD-10-CM | POA: Insufficient documentation

## 2018-04-22 HISTORY — DX: Migraine, unspecified, not intractable, without status migrainosus: G43.909

## 2018-04-22 NOTE — Discharge Instructions (Addendum)
Symptoms are consistent with migraine.  And typical for your migraine pattern.  Treat symptomatically with rest.  Referral given to neurology.  Understands since you are breast-feeding you do not want any medications at this time.  Symptoms are not consistent with coronavirus at this time.  No concerns for any coronavirus exposure by you to any of your family members or contacts.

## 2018-04-22 NOTE — ED Provider Notes (Signed)
[facility] EMERGENCY DEPARTMENT Provider Note   CSN: [id number] Arrival date & time: 04/22/18  1113    History   Chief Complaint Chief Complaint  Patient presents with  . Migraine    HPI Rebekah [name] is a 28 y.o. female.     Patient with known history of migraines.  Patient with a headache typical for her migraines for the past 3 days with bilateral temporal throbbing pain.  Associated with some light sensitivity.  Nausea and then did have few episodes of vomiting no diarrhea.  No upper respiratory symptoms at all.  No fevers no neck stiffness.  Patient is currently breast-feeding.     Past Medical History:  Diagnosis Date  . Bartholin's cyst   . Chlamydia   . Depression   . Eczema   . Headache   . Migraine   . Vaginal Pap smear, abnormal     Patient Active Problem List   Diagnosis Date Noted  . Normal labor and delivery 11/30/2017  . 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 Supervision of normal pregnancy 05/28/2017  . Bartholin's cyst   . Eczema   . Social anxiety disorder 03/25/2013  . Depression 03/25/2013    Past Surgical History:  Procedure Laterality Date  . WISDOM TOOTH EXTRACTION       OB History    Gravida  2   Para  2   Term  2   Preterm  0   AB  0   Living  2     SAB  0   TAB  0   Ectopic  0   Multiple  0   Live Births  2            Home Medications    Prior to Admission medications   Medication Sig Start Date End Date Taking? Authorizing Provider  acetaminophen (TYLENOL) 500 MG tablet Take 500 mg by mouth every 6 (six) hours as needed for mild pain.    [provider]  norethindrone (ORTHO MICRONOR) 0.35 MG tablet Take 1 tablet (0.35 mg total) by mouth daily. 12/16/17 12/16/18  Allayne Stack, DO  Ondansetron (ZOFRAN ODT PO) Take 1 tablet by mouth once as needed (for nausea/vomiting).    [provider]  ondansetron (ZOFRAN) 4 MG tablet Take 1 tablet (4 mg total) by mouth every 6 (six)  hours. 02/02/18   Raeford Razor, MD    Family History Family History  Problem Relation Age of Onset  . Cancer Mother        lung    Social History Social History   Tobacco Use  . Smoking status: Never Smoker  . Smokeless tobacco: Never Used  Substance Use Topics  . Alcohol use: No  . Drug use: Yes    Types: Marijuana     Allergies   Patient has no known allergies.   Review of Systems Review of Systems  Constitutional: Negative for chills and fever.  HENT: Negative for congestion, rhinorrhea and sore throat.   Eyes: Positive for photophobia. Negative for visual disturbance.  Respiratory: Negative for cough and shortness of breath.   Cardiovascular: Negative for chest pain and leg swelling.  Gastrointestinal: Positive for nausea and vomiting. Negative for abdominal pain and diarrhea.  Genitourinary: Negative for dysuria.  Musculoskeletal: Negative for back pain, neck pain and neck stiffness.  Skin: Negative for rash.  Neurological: Positive for headaches. Negative for dizziness and light-headedness.  Hematological: Does not bruise/bleed easily.  Psychiatric/Behavioral: Negative for  confusion.     Physical Exam Updated Vital Signs BP 116/80 (BP Location: Right Arm)   Pulse 81   Temp 98.2 F (36.8 C) (Oral)   Resp 18   Ht 1.6 m (5\' 3" )   Wt 50.3 kg   LMP 02/23/2018   SpO2 100%   BMI 19.66 kg/m   Physical Exam Vitals signs and nursing note reviewed.  Constitutional:      General: She is not in acute distress.    Appearance: Normal appearance. She is well-developed.  HENT:     Head: Normocephalic and atraumatic.     Nose: Nose normal. No congestion.     Mouth/Throat:     Mouth: Mucous membranes are moist.  Eyes:     Extraocular Movements: Extraocular movements intact.     Conjunctiva/sclera: Conjunctivae normal.     Pupils: Pupils are equal, round, and reactive to light.  Neck:     Musculoskeletal: Normal range of motion and neck supple. No neck  rigidity.  Cardiovascular:     Rate and Rhythm: Normal rate and regular rhythm.     Heart sounds: Normal heart sounds. No murmur.  Pulmonary:     Effort: Pulmonary effort is normal. No respiratory distress.     Breath sounds: Normal breath sounds. No rhonchi or rales.  Abdominal:     Palpations: Abdomen is soft.     Tenderness: There is no abdominal tenderness.  Musculoskeletal: Normal range of motion.        General: No swelling.  Skin:    General: Skin is warm and dry.     Capillary Refill: Capillary refill takes less than 2 seconds.  Neurological:     General: No focal deficit present.     Mental Status: She is alert and oriented to person, place, and time.     Cranial Nerves: No cranial nerve deficit.     Sensory: No sensory deficit.     Motor: No weakness.     Coordination: Coordination normal.      ED Treatments / Results  Labs (all labs ordered are listed, but only abnormal results are displayed) Labs Reviewed - No data to display  EKG None  Radiology No results found.  Procedures Procedures (including critical care time)  Medications Ordered in ED Medications - No data to display   Initial Impression / Assessment and Plan / ED Course  I have reviewed the triage vital signs and the nursing notes.  Pertinent labs & imaging results that were available during my care of the patient were reviewed by me and considered in my medical decision making (see chart for details).        Nontoxic no acute distress.  Patient currently breast-feeding.  Does not want migraine cocktail.  Main concern was that because she occasionally gets vomiting with her migraine headaches which is typical her husband's job would not allow him to go to work out of concern for coronavirus.  Patient symptoms are not consistent with coronavirus at all.  It is typical for her migraine pattern.  Final Clinical Impressions(s) / ED Diagnoses   Final diagnoses:  Intractable migraine without  aura and with status migrainosus    ED Discharge Orders    None       Vanetta Mulders, MD 04/22/18 1308

## 2018-04-22 NOTE — ED Triage Notes (Addendum)
Onset headache 3 days, vomiting today, pain better today. But due to her vomiting this morning her husband had to report it at work. They will not let him return to work until she is evaluated.

## 2018-06-23 ENCOUNTER — Ambulatory Visit: Payer: Medicaid Other | Admitting: Family Medicine

## 2018-06-30 ENCOUNTER — Ambulatory Visit (INDEPENDENT_AMBULATORY_CARE_PROVIDER_SITE_OTHER): Payer: Medicaid Other | Admitting: Family Medicine

## 2018-06-30 ENCOUNTER — Encounter: Payer: Self-pay | Admitting: Family Medicine

## 2018-06-30 ENCOUNTER — Other Ambulatory Visit: Payer: Self-pay

## 2018-06-30 VITALS — BP 110/62 | HR 73 | Temp 98.1°F | Resp 12 | Ht 63.0 in | Wt 106.0 lb

## 2018-06-30 DIAGNOSIS — G43009 Migraine without aura, not intractable, without status migrainosus: Secondary | ICD-10-CM

## 2018-06-30 DIAGNOSIS — Z7689 Persons encountering health services in other specified circumstances: Secondary | ICD-10-CM | POA: Diagnosis not present

## 2018-06-30 NOTE — Progress Notes (Signed)
None   Subjective:     Patient ID: Rebekah [name], female   DOB: [birth year], 28 y.o.   MRN: [id number]  Rebekah [name] presents for New Patient (Initial Visit) (Establish care) 28 year old female patient to establish care with me at the clinic.  Prominent history that she would like to discuss today includes migraine.  She is stable and not having any issues with anything else at this time.  Migraines: She reports that she has been getting more frequent migraines.  She is gotten them in the past before.  But she did have one while she was pregnant.  She is currently breast-feeding her 6562-month-old.  Would like to try to make it a full year before she stops breast-feeding has noted that she is unable to take Excedrin Migraine secondary to breast-feeding.  She reports that ibuprofen has not worked for her in the past.  She reports that Tylenol alone does not work for her.  She does not want to be on anything stronger that would inhibit her from breast-feeding if she can avoid it.  Her migraines are described the start in the temporal area with pulsation which increases to throbbing.  It feels like the nerve endings in her eyes are having a lot of pain and makes it difficult for her to move her eyes or opening them.  She has intermittent nausea and vomiting as well.  She reports that if she is able to lay down in a dark room and go to sleep this is usually the best benefit for her.  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  Additionally she reports that she is having about 4-5 migraines a month.  Today she has no headache in the office.  She does have a history of having a low-grade squamous intraepithelial lesion noted on Pap smear of April 2019.  She is followed by GYN at this time.  History includes a mother who died of lung cancer.  She is unsure of her father and his whereabouts or health.  Brother is alive.   Does not have any sisters.  Does not use tobacco, has occasionally used marijuana in the past but is not currently using.  Is not drinking alcohol at this time.  Is breast-feeding currently.  Social: Lives with Jeremy-boyfriend of 3.5 years.  They have 2 children together [place]Haley and Natalie.  There are no pets in the home.  They report having smoke detectors in the home.  Reports wearing sunscreen and a seatbelt.  She reports that she eats a lot of fresh fruits and veggies and occasional meats.  She reports that she drinks 2 cups of caffeine daily this is either consumed and coffee, tea or Pepsi.  She reports that she mostly likes to drink water.    Past Medical, Surgical, Social History, Allergies, and Medications have been Reviewed.    Past Medical History:  Diagnosis Date  . Bartholin's cyst   . Chlamydia   . Depression   . Eczema   . Headache   . Migraine   . Vaginal Pap smear, abnormal    Past Surgical History:  Procedure Laterality Date  . WISDOM TOOTH EXTRACTION     Social History   Socioeconomic History  . Marital status: Significant Other    Spouse name: Dewaine OatsJeremy Barth  . Number of children: 2  . Years of education: 3913  . Highest education level:  12th grade  Occupational History  . Not on file  Social Needs  . Financial resource strain: Not very hard  . Food insecurity:    Worry: Never true    Inability: Never true  . Transportation needs:    Medical: No    Non-medical: No  Tobacco Use  . Smoking status: Never Smoker  . Smokeless tobacco: Never Used  Substance and Sexual Activity  . Alcohol use: No  . Drug use: Not Currently    Types: Marijuana  . Sexual activity: Yes    Birth control/protection: None  Lifestyle  . Physical activity:    Days per week: 3 days    Minutes per session: 30 min  . Stress: Only a little  Relationships  . Social connections:    Talks on phone: Twice a week    Gets together: Once a week    Attends religious service: Never     Active member of club or organization: No    Attends meetings of clubs or organizations: Never    Relationship status: Living with partner  . Intimate partner violence:    Fear of current or ex partner: No    Emotionally abused: No    Physically abused: No    Forced sexual activity: No  Other Topics Concern  . Not on file  Social History Narrative   Lives with Riki Rusk- boyfriend 3.5 years   Two children   Hailey: 37.53 years old (2020)   Dorene Grebe: 7 months (2020) [birth year] birth day for both      No pets in the home   Smoke detectors   Wears sunscreen and seat belt      Diet: Eats fruits veggies meats   Caffeine: 2 cups-coffee or tea-pepsi, most water    Outpatient Encounter Medications as of 06/30/2018  Medication Sig  . acetaminophen (TYLENOL) 500 MG tablet Take 500 mg by mouth every 6 (six) hours as needed for mild pain.  Marland Kitchen norethindrone (ORTHO MICRONOR) 0.35 MG tablet Take 1 tablet (0.35 mg total) by mouth daily.  . [DISCONTINUED] Ondansetron (ZOFRAN ODT PO) Take 1 tablet by mouth once as needed (for nausea/vomiting).  . [DISCONTINUED] ondansetron (ZOFRAN) 4 MG tablet Take 1 tablet (4 mg total) by mouth every 6 (six) hours.   No facility-administered encounter medications on file as of 06/30/2018.    No Known Allergies  Review of Systems  Constitutional: Negative for activity change, appetite change, chills and fatigue.  HENT: Negative for congestion, sinus pressure, sinus pain, tinnitus and trouble swallowing.   Eyes: Negative.   Respiratory: Negative for cough, chest tightness and shortness of breath.   Cardiovascular: Negative for chest pain.  Gastrointestinal: Negative.   Endocrine: Negative.   Genitourinary: Negative.   Musculoskeletal: Negative.   Skin: Negative.   Allergic/Immunologic: Negative.   Neurological: Negative for dizziness and headaches.  Hematological: Negative.   Psychiatric/Behavioral: Negative.   All other systems reviewed and are negative.       Objective:     BP 110/62   Pulse 73   Temp 98.1 F (36.7 C) (Oral)   Resp 12   Ht 5\' 3"  (1.6 m)   Wt 106 lb 0.6 oz (48.1 kg)   SpO2 99%   BMI 18.78 kg/m   Physical Exam Constitutional:      Appearance: Normal appearance. She is normal weight.  HENT:     Head: Normocephalic and atraumatic.     Right Ear: External ear normal.     Left Ear:  External ear normal.     Nose: Nose normal.  Eyes:     General: No scleral icterus.       Right eye: No discharge.        Left eye: No discharge.     Conjunctiva/sclera: Conjunctivae normal.  Neck:     Musculoskeletal: Normal range of motion and neck supple.  Cardiovascular:     Rate and Rhythm: Normal rate and regular rhythm.     Pulses: Normal pulses.     Heart sounds: Normal heart sounds.  Pulmonary:     Effort: Pulmonary effort is normal.     Breath sounds: Normal breath sounds.  Abdominal:     General: Abdomen is flat. Bowel sounds are normal.     Palpations: Abdomen is soft.  Musculoskeletal: Normal range of motion.  Skin:    General: Skin is warm and dry.     Capillary Refill: Capillary refill takes less than 2 seconds.  Neurological:     Mental Status: She is alert and oriented to person, place, and time.  Psychiatric:        Mood and Affect: Mood normal.        Behavior: Behavior normal.        Thought Content: Thought content normal.        Judgment: Judgment normal.        Assessment and Plan        1. Encounter to establish care Establish care today in the office.  Completed and documentation please see above.  Up-to-date on all health measures at this time.   2. Migraine without aura and without status migrainosus, not intractable Stable, does not want to be on a daily medication for maintenance at this time.  But realizes that she might need to.  She reports that she is breast-feeding at this time and does not want to take anything if it interferes with her breast-feeding.  Would like to try to make it to a  year which is 5 more months from now.  Reported that Excedrin Migraine used to basically take care of her migraines when she would get them.  She reports that it is not able to take the Excedrin Migraine while breast-feeding.  Advised for her to try to break apart the components of the Excedrin Migraine such as drinking a couple coffee or tea and then taking thousand milligrams of Tylenol.  And trying to lay down.  If this could work until she finishes breast-feeding we could possibly look at getting her on a preventative medication as she is currently having 5 or more migraines a month.    Return in about 5 months (around 11/30/2018) for annual visit .        Freddy Finner, DNP, AGNP-BC [facility] [address] [place], [place] [address] Office Hours: Mon-Thurs 8 am-5 pm; Fri 8 am-12 pm Office Phone:  [phone]  Office Fax: [phone]

## 2018-06-30 NOTE — Patient Instructions (Signed)
    Thank you for coming into the office today. I appreciate the opportunity to provide you with the care for your health and wellness. Today we discussed: migraines and overall care  Take 1,000 mg of tylenol and drink at least 45-60 mg of caffeine at onset of possible migraine.  Can take ibuprofen with if desired.  If want to sleep breast feed and then take some benadryl and wait about 12 hours before breastfeeding again.   WASH YOUR HANDS WELL AND FREQUENTLY. AVOID TOUCHING YOUR FACE, UNLESS YOUR HANDS ARE FRESHLY WASHED.  GET FRESH AIR DAILY. STAY HYDRATED WITH WATER.   It was a pleasure to see you and I look forward to continuing to work together on your health and well-being. Please do not hesitate to call the office if you need care or have questions about your care.  Have a wonderful day and week. With Gratitude, Tereasa Coop, DNP, AGNP-BC

## 2018-07-02 ENCOUNTER — Encounter: Payer: Self-pay | Admitting: Family Medicine

## 2018-09-14 ENCOUNTER — Emergency Department (HOSPITAL_COMMUNITY): Payer: Medicaid Other | Admitting: Anesthesiology

## 2018-09-14 ENCOUNTER — Encounter (HOSPITAL_COMMUNITY): Payer: Self-pay | Admitting: Emergency Medicine

## 2018-09-14 ENCOUNTER — Encounter (HOSPITAL_COMMUNITY)
Admission: EM | Disposition: A | Discharge status: Home / Self Care | Payer: Self-pay | Source: Home / Self Care | Attending: Emergency Medicine

## 2018-09-14 ENCOUNTER — Emergency Department (HOSPITAL_COMMUNITY): Payer: Medicaid Other

## 2018-09-14 ENCOUNTER — Emergency Department (HOSPITAL_COMMUNITY)
Admission: EM | Admit: 2018-09-14 | Discharge: 2018-09-14 | Disposition: A | Discharge status: Home / Self Care | Payer: Medicaid Other | Attending: Emergency Medicine | Admitting: Emergency Medicine

## 2018-09-14 ENCOUNTER — Other Ambulatory Visit: Payer: Self-pay

## 2018-09-14 DIAGNOSIS — K3589 Other acute appendicitis without perforation or gangrene: Secondary | ICD-10-CM

## 2018-09-14 DIAGNOSIS — K3580 Unspecified acute appendicitis: Secondary | ICD-10-CM

## 2018-09-14 DIAGNOSIS — K3533 Acute appendicitis with perforation and localized peritonitis, with abscess: Secondary | ICD-10-CM | POA: Insufficient documentation

## 2018-09-14 DIAGNOSIS — R109 Unspecified abdominal pain: Secondary | ICD-10-CM | POA: Diagnosis present

## 2018-09-14 DIAGNOSIS — Z793 Long term (current) use of hormonal contraceptives: Secondary | ICD-10-CM | POA: Insufficient documentation

## 2018-09-14 DIAGNOSIS — Z20828 Contact with and (suspected) exposure to other viral communicable diseases: Secondary | ICD-10-CM | POA: Insufficient documentation

## 2018-09-14 HISTORY — PX: LAPAROSCOPIC APPENDECTOMY: SHX408

## 2018-09-14 LAB — COMPREHENSIVE METABOLIC PANEL
ALT: 14 U/L (ref 0–44)
AST: 18 U/L (ref 15–41)
Albumin: 5.4 g/dL — ABNORMAL HIGH (ref 3.5–5.0)
Alkaline Phosphatase: 97 U/L (ref 38–126)
Anion gap: 14 (ref 5–15)
BUN: 8 mg/dL (ref 6–20)
CO2: 24 mmol/L (ref 22–32)
Calcium: 9.7 mg/dL (ref 8.9–10.3)
Chloride: 100 mmol/L (ref 98–111)
Creatinine, Ser: 0.63 mg/dL (ref 0.44–1.00)
GFR calc Af Amer: >60 mL/min (ref >60)
GFR calc non Af Amer: >60 mL/min (ref >60)
Glucose, Bld: 129 mg/dL — ABNORMAL HIGH (ref 70–99)
Potassium: 3.7 mmol/L (ref 3.5–5.1)
Sodium: 138 mmol/L (ref 135–145)
Total Bilirubin: 1.4 mg/dL — ABNORMAL HIGH (ref 0.3–1.2)
Total Protein: 8.6 g/dL — ABNORMAL HIGH (ref 6.5–8.1)

## 2018-09-14 LAB — URINALYSIS, ROUTINE W REFLEX MICROSCOPIC
Bilirubin Urine: NEGATIVE
Glucose, UA: NEGATIVE mg/dL
Hgb urine dipstick: NEGATIVE
Ketones, ur: 20 mg/dL — AB
Leukocytes,Ua: NEGATIVE
Nitrite: NEGATIVE
Protein, ur: NEGATIVE mg/dL
Specific Gravity, Urine: 1.018 (ref 1.005–1.030)
pH: 8 (ref 5.0–8.0)

## 2018-09-14 LAB — CBC WITH DIFFERENTIAL/PLATELET
Abs Immature Granulocytes: 0.06 10*3/uL (ref 0.00–0.07)
Basophils Absolute: 0 10*3/uL (ref 0.0–0.1)
Basophils Relative: 0 %
Eosinophils Absolute: 0 10*3/uL (ref 0.0–0.5)
Eosinophils Relative: 0 %
HCT: 43.9 % (ref 36.0–46.0)
Hemoglobin: 14.3 g/dL (ref 12.0–15.0)
Immature Granulocytes: 0 %
Lymphocytes Relative: 5 %
Lymphs Abs: 0.7 10*3/uL (ref 0.7–4.0)
MCH: 29.3 pg (ref 26.0–34.0)
MCHC: 32.6 g/dL (ref 30.0–36.0)
MCV: 90 fL (ref 80.0–100.0)
Monocytes Absolute: 0.5 10*3/uL (ref 0.1–1.0)
Monocytes Relative: 3 %
Neutro Abs: 12.7 10*3/uL — ABNORMAL HIGH (ref 1.7–7.7)
Neutrophils Relative %: 92 %
Platelets: 222 10*3/uL (ref 150–400)
RBC: 4.88 MIL/uL (ref 3.87–5.11)
RDW: 13.1 % (ref 11.5–15.5)
WBC: 14 10*3/uL — ABNORMAL HIGH (ref 4.0–10.5)
nRBC: 0 % (ref 0.0–0.2)

## 2018-09-14 LAB — SARS CORONAVIRUS 2 BY RT PCR (HOSPITAL ORDER, PERFORMED IN ~~LOC~~ HOSPITAL LAB): SARS Coronavirus 2: NEGATIVE

## 2018-09-14 LAB — PREGNANCY, URINE: Preg Test, Ur: NEGATIVE

## 2018-09-14 LAB — HCG, QUANTITATIVE, PREGNANCY: hCG, Beta Chain, Quant, S: <1 m[IU]/mL (ref <5)

## 2018-09-14 LAB — LIPASE, BLOOD: Lipase: 28 U/L (ref 11–51)

## 2018-09-14 SURGERY — APPENDECTOMY, LAPAROSCOPIC
Anesthesia: General

## 2018-09-14 MED ORDER — LIDOCAINE 2% (20 MG/ML) 5 ML SYRINGE
INTRAMUSCULAR | Status: AC
  Filled 2018-09-14: qty 5

## 2018-09-14 MED ORDER — SODIUM CHLORIDE 0.9 % IR SOLN
Status: DC | PRN
  Administered 2018-09-14: 1000 mL

## 2018-09-14 MED ORDER — PROPOFOL 10 MG/ML IV BOLUS
INTRAVENOUS | Status: AC
  Filled 2018-09-14: qty 20

## 2018-09-14 MED ORDER — FENTANYL CITRATE (PF) 100 MCG/2ML IJ SOLN
INTRAMUSCULAR | Status: DC | PRN
  Administered 2018-09-14: 50 ug via INTRAVENOUS
  Administered 2018-09-14 (×2): 100 ug via INTRAVENOUS

## 2018-09-14 MED ORDER — SODIUM CHLORIDE 0.9 % IV SOLN
2.0000 g | INTRAVENOUS | Status: AC
  Administered 2018-09-14: 2 g via INTRAVENOUS

## 2018-09-14 MED ORDER — MIDAZOLAM HCL 2 MG/2ML IJ SOLN
INTRAMUSCULAR | Status: AC
  Filled 2018-09-14: qty 2

## 2018-09-14 MED ORDER — IOHEXOL 300 MG/ML  SOLN
75.0000 mL | Freq: Once | INTRAMUSCULAR | Status: AC | PRN
  Administered 2018-09-14: 75 mL via INTRAVENOUS

## 2018-09-14 MED ORDER — CHLORHEXIDINE GLUCONATE CLOTH 2 % EX PADS: 6.0000 | MEDICATED_PAD | Freq: Once | CUTANEOUS | Status: DC

## 2018-09-14 MED ORDER — HYDROCODONE-ACETAMINOPHEN 7.5-325 MG PO TABS
1.0000 | ORAL_TABLET | Freq: Once | ORAL | Status: AC | PRN
  Administered 2018-09-14: 1 via ORAL
  Filled 2018-09-14: qty 1

## 2018-09-14 MED ORDER — PROPOFOL 10 MG/ML IV BOLUS
INTRAVENOUS | Status: DC | PRN
  Administered 2018-09-14: 150 mg via INTRAVENOUS

## 2018-09-14 MED ORDER — SODIUM CHLORIDE 0.9 % IV SOLN: INTRAVENOUS | Status: DC

## 2018-09-14 MED ORDER — SODIUM CHLORIDE 0.9 % IV BOLUS
1000.0000 mL | Freq: Once | INTRAVENOUS | Status: AC
  Administered 2018-09-14: 1000 mL via INTRAVENOUS

## 2018-09-14 MED ORDER — KETOROLAC TROMETHAMINE 30 MG/ML IJ SOLN
INTRAMUSCULAR | Status: AC
  Filled 2018-09-14: qty 1

## 2018-09-14 MED ORDER — SUCCINYLCHOLINE CHLORIDE 200 MG/10ML IV SOSY
PREFILLED_SYRINGE | INTRAVENOUS | Status: AC
  Filled 2018-09-14: qty 10

## 2018-09-14 MED ORDER — ONDANSETRON HCL 4 MG/2ML IJ SOLN
INTRAMUSCULAR | Status: DC | PRN
  Administered 2018-09-14: 4 mg via INTRAVENOUS

## 2018-09-14 MED ORDER — SUCCINYLCHOLINE CHLORIDE 200 MG/10ML IV SOSY
PREFILLED_SYRINGE | INTRAVENOUS | Status: DC | PRN
  Administered 2018-09-14: 120 mg via INTRAVENOUS

## 2018-09-14 MED ORDER — SODIUM CHLORIDE 0.9 % IV SOLN
2.0000 g | Freq: Once | INTRAVENOUS | Status: AC
  Administered 2018-09-14: 2 g via INTRAVENOUS
  Filled 2018-09-14: qty 20

## 2018-09-14 MED ORDER — METRONIDAZOLE IN NACL 5-0.79 MG/ML-% IV SOLN
500.0000 mg | Freq: Once | INTRAVENOUS | Status: AC
  Administered 2018-09-14: 500 mg via INTRAVENOUS
  Filled 2018-09-14: qty 100

## 2018-09-14 MED ORDER — SUGAMMADEX SODIUM 500 MG/5ML IV SOLN
INTRAVENOUS | Status: AC
  Filled 2018-09-14: qty 5

## 2018-09-14 MED ORDER — ROCURONIUM BROMIDE 10 MG/ML (PF) SYRINGE
PREFILLED_SYRINGE | INTRAVENOUS | Status: DC | PRN
  Administered 2018-09-14: 45 mg via INTRAVENOUS
  Administered 2018-09-14: 5 mg via INTRAVENOUS

## 2018-09-14 MED ORDER — FAMOTIDINE IN NACL 20-0.9 MG/50ML-% IV SOLN
20.0000 mg | Freq: Once | INTRAVENOUS | Status: AC
  Administered 2018-09-14: 08:00:00 20 mg via INTRAVENOUS
  Filled 2018-09-14: qty 50

## 2018-09-14 MED ORDER — MIDAZOLAM HCL 5 MG/5ML IJ SOLN
INTRAMUSCULAR | Status: DC | PRN
  Administered 2018-09-14: 2 mg via INTRAVENOUS

## 2018-09-14 MED ORDER — ONDANSETRON HCL 4 MG/2ML IJ SOLN
4.0000 mg | INTRAMUSCULAR | Status: AC | PRN
  Administered 2018-09-14 (×2): 4 mg via INTRAVENOUS
  Filled 2018-09-14 (×2): qty 2

## 2018-09-14 MED ORDER — SUGAMMADEX SODIUM 500 MG/5ML IV SOLN
INTRAVENOUS | Status: DC | PRN
  Administered 2018-09-14: 200 mg via INTRAVENOUS

## 2018-09-14 MED ORDER — KETOROLAC TROMETHAMINE 30 MG/ML IJ SOLN
INTRAMUSCULAR | Status: DC | PRN
  Administered 2018-09-14: 30 mg via INTRAVENOUS

## 2018-09-14 MED ORDER — METRONIDAZOLE IN NACL 5-0.79 MG/ML-% IV SOLN
INTRAVENOUS | Status: AC
  Filled 2018-09-14: qty 100

## 2018-09-14 MED ORDER — ROCURONIUM BROMIDE 10 MG/ML (PF) SYRINGE
PREFILLED_SYRINGE | INTRAVENOUS | Status: AC
  Filled 2018-09-14: qty 10

## 2018-09-14 MED ORDER — MIDAZOLAM HCL 2 MG/2ML IJ SOLN: 0.5000 mg | Freq: Once | INTRAMUSCULAR | Status: DC | PRN

## 2018-09-14 MED ORDER — LACTATED RINGERS IV SOLN
INTRAVENOUS | Status: DC
  Administered 2018-09-14: 15:00:00 via INTRAVENOUS

## 2018-09-14 MED ORDER — HYDROMORPHONE HCL 1 MG/ML IJ SOLN: 0.2500 mg | INTRAMUSCULAR | Status: DC | PRN

## 2018-09-14 MED ORDER — SODIUM CHLORIDE 0.9 % IV SOLN
INTRAVENOUS | Status: AC
  Filled 2018-09-14: qty 2

## 2018-09-14 MED ORDER — DEXAMETHASONE SODIUM PHOSPHATE 4 MG/ML IJ SOLN
INTRAMUSCULAR | Status: DC | PRN
  Administered 2018-09-14: 4 mg via INTRAVENOUS

## 2018-09-14 MED ORDER — PROMETHAZINE HCL 25 MG/ML IJ SOLN: 6.2500 mg | INTRAMUSCULAR | Status: DC | PRN

## 2018-09-14 MED ORDER — HYDROCODONE-ACETAMINOPHEN 5-325 MG PO TABS: 1.0000 | ORAL_TABLET | ORAL | 0 refills | Status: DC | PRN

## 2018-09-14 MED ORDER — BUPIVACAINE HCL (PF) 0.5 % IJ SOLN
INTRAMUSCULAR | Status: DC | PRN
  Administered 2018-09-14: 10 mL

## 2018-09-14 MED ORDER — DOCUSATE SODIUM 100 MG PO CAPS: 100.0000 mg | ORAL_CAPSULE | Freq: Two times a day (BID) | ORAL | 2 refills | Status: DC

## 2018-09-14 MED ORDER — BUPIVACAINE HCL (PF) 0.5 % IJ SOLN
INTRAMUSCULAR | Status: AC
  Filled 2018-09-14: qty 30

## 2018-09-14 MED ORDER — FENTANYL CITRATE (PF) 250 MCG/5ML IJ SOLN
INTRAMUSCULAR | Status: AC
  Filled 2018-09-14: qty 5

## 2018-09-14 MED ORDER — ONDANSETRON HCL 4 MG PO TABS: 4.0000 mg | ORAL_TABLET | Freq: Every day | ORAL | 1 refills | Status: AC | PRN

## 2018-09-14 SURGICAL SUPPLY — 45 items
BAG RETRIEVAL 10 (BASKET) ×1
BAG RETRIEVAL 10MM (BASKET) ×1
BLADE SURG 15 STRL LF DISP TIS (BLADE) ×1 IMPLANT
BLADE SURG 15 STRL SS (BLADE) ×2
CHLORAPREP W/TINT 26 (MISCELLANEOUS) ×3 IMPLANT
CLOTH BEACON ORANGE TIMEOUT ST (SAFETY) ×3 IMPLANT
COVER LIGHT HANDLE STERIS (MISCELLANEOUS) ×6 IMPLANT
COVER WAND RF STERILE (DRAPES) ×3 IMPLANT
CUTTER FLEX LINEAR 45M (STAPLE) ×3 IMPLANT
DECANTER SPIKE VIAL GLASS SM (MISCELLANEOUS) ×3 IMPLANT
DERMABOND ADVANCED (GAUZE/BANDAGES/DRESSINGS) ×2
DERMABOND ADVANCED .7 DNX12 (GAUZE/BANDAGES/DRESSINGS) ×1 IMPLANT
ELECT REM PT RETURN 9FT ADLT (ELECTROSURGICAL) ×3
ELECTRODE REM PT RTRN 9FT ADLT (ELECTROSURGICAL) ×1 IMPLANT
GLOVE BIO SURGEON STRL SZ 6.5 (GLOVE) ×2 IMPLANT
GLOVE BIO SURGEONS STRL SZ 6.5 (GLOVE) ×1
GLOVE BIOGEL PI IND STRL 6.5 (GLOVE) ×1 IMPLANT
GLOVE BIOGEL PI IND STRL 7.0 (GLOVE) ×4 IMPLANT
GLOVE BIOGEL PI INDICATOR 6.5 (GLOVE) ×2
GLOVE BIOGEL PI INDICATOR 7.0 (GLOVE) ×8
GLOVE ECLIPSE 6.5 STRL STRAW (GLOVE) ×3 IMPLANT
GOWN STRL REUS W/TWL LRG LVL3 (GOWN DISPOSABLE) ×6 IMPLANT
INST SET LAPROSCOPIC AP (KITS) ×3 IMPLANT
KIT TURNOVER KIT A (KITS) ×3 IMPLANT
MANIFOLD NEPTUNE II (INSTRUMENTS) ×3 IMPLANT
NEEDLE INSUFFLATION 14GA 120MM (NEEDLE) ×3 IMPLANT
NS IRRIG 1000ML POUR BTL (IV SOLUTION) ×3 IMPLANT
PACK LAP CHOLE LZT030E (CUSTOM PROCEDURE TRAY) ×3 IMPLANT
PAD ARMBOARD 7.5X6 YLW CONV (MISCELLANEOUS) ×3 IMPLANT
PENCIL HANDSWITCHING (ELECTRODE) ×3 IMPLANT
RELOAD 45 VASCULAR/THIN (ENDOMECHANICALS) IMPLANT
RELOAD STAPLE TA45 3.5 REG BLU (ENDOMECHANICALS) ×3 IMPLANT
SET BASIN LINEN APH (SET/KITS/TRAYS/PACK) ×3 IMPLANT
SHEARS HARMONIC ACE PLUS 36CM (ENDOMECHANICALS) ×3 IMPLANT
SUT MNCRL AB 4-0 PS2 18 (SUTURE) ×3 IMPLANT
SUT VICRYL 0 UR6 27IN ABS (SUTURE) ×3 IMPLANT
SYS BAG RETRIEVAL 10MM (BASKET) ×1
SYSTEM BAG RETRIEVAL 10MM (BASKET) ×1 IMPLANT
TRAY FOLEY CATH 16FR SILVER (SET/KITS/TRAYS/PACK) ×3 IMPLANT
TROCAR ENDO BLADELESS 11MM (ENDOMECHANICALS) ×3 IMPLANT
TROCAR ENDO BLADELESS 12MM (ENDOMECHANICALS) ×3 IMPLANT
TROCAR XCEL NON-BLD 5MMX100MML (ENDOMECHANICALS) ×3 IMPLANT
TUBING INSUFFLATION (TUBING) ×3 IMPLANT
WARMER LAPAROSCOPE (MISCELLANEOUS) ×3 IMPLANT
YANKAUER SUCT 12FT TUBE ARGYLE (SUCTIONS) ×3 IMPLANT

## 2018-09-14 NOTE — Progress Notes (Signed)
[facility]  Looking good post op. Wants to go home. Husband will stay with her 24 hrs. zofran added to Rx to help with any post op nausea.  Curlene Labrum, MD [facility] [address] [place], [place] [address] [phone] (office)

## 2018-09-14 NOTE — Op Note (Signed)
[facility]  Date of Surgery: 09/14/2018  Admit Date: 09/14/2018   Performing Service: General  Surgeon(s) and Role:    * Lucretia RoersBridges, Nafis Farnan C, MD - Primary   Pre-operative Diagnosis: Acute Appendicitis  Post-operative Diagnosis: Acute Appendicitis  Procedure Performed: Laparoscopic Appendectomy   Surgeon: Leatrice JewelsLindsay C. Henreitta LeberBridges, MD   Assistant: No qualified resident was available.   Anesthesia: General   Findings:  The appendix was found to be inflamed. There were not signs of necrosis. There was not perforation. There was not abscess formation.   Estimated Blood Loss: Minimal   Specimens:  ID Type Source Tests Collected by Time Destination  1 : appendix GI Appendix SURGICAL PATHOLOGY Lucretia RoersBridges, Kaneesha Constantino C, MD 09/14/2018 1532      Complications: None; patient tolerated the procedure well.   Disposition: PACU - hemodynamically stable.   Condition: stable   Indications: The patient presented with a 2 day history of right-sided abdominal pain. A CT revealed findings consistent with acute appendicitis.   Procedure Details  Prior to the procedure, the risks, benefits, complications, treatment options, and expected outcomes were discussed with the patient and/or family, including but not limited to the risk of bleeding, infection, finding of a normal appendix, and the need for conversion to an open procedure. There was concurrence with the proposed plan and informed consent was obtained. The patient was taken to the operating room, identified as Rebekah [name] and the procedure verified as Laproscopic Appendectomy.    The patient was placed in the supine position and general anesthesia was induced, along with placement of orogastric tube, SCD's, and a Foley catheter. The abdomen was prepped and draped in a sterile fashion. The abdomen was entered with Veress technique in the left upper quadrant. Intraperitoneal placement was confirmed with saline drop, low entry  pressures, and easy insufflation. An incision was made infraumbilical and a 10 mm optiview trocar was placed under direct visualization with a 0 degree scope. The 10 mm 0 degree scope was placed in the abdomen and no evidence of injury was identified. A 12 mm port was placed in the left lower quadrant of the abdomen after skin incision with trocar placement under direct vision. A careful evaluation of the entire abdomen was carried out. An additional 5 mm port was placed in the suprapubic area under direct vision.  The patient was placed in Trendelenburg and left lateral decubitus position. The small intestines were retracted in the cephalad and left lateral direction away from the pelvis and right lower quadrant. The patient was found to have an retrocecal appendix that was adherent to the cecum. There was significant edema and inflammatory rind. There was not evidence of perforation.  Adhesions to the lateral side wall were taken down to allow for the cecum to roll over and the posterior part of the cecum to be fully exposed where the appendix was adherent. The base was obvious coming off the tinea.   The appendix was carefully dissected. A window was made in the mesoappendix at the base of the appendix. The base appeared healthy. The appendix was divided at its base using a standard endo-GIA stapler. Minimal appendiceal stump was left in place. The mesoappendix was taken with the harmonic energy device as I gently peeled it from the retrocecal position, ensuring that I was not cauterizing to close the cecal wall. The appendix was placed within an Endocatch specimen bag. There was no evidence of bleeding, leakage, or complication after division of the appendix.  Any remaining blood  or pus was suctioned out from the abdomen, hemostasis was confirmed. The endocatch bag was removed via the 12 mm port, then the abdomen desufflated. The appendix was passed off the field as a specimen.   The the 12 mm and 10 mm  port sites were closed with a 0 Vicryl suture. The trocar site skin wounds were closed using subcuticular 4-0 Monocryl suture and dermabond. The patient was then awakened from general anesthesia, extubated, and taken to PACU for recovery.   Instrument, sponge, and needle counts were correct at the conclusion of the case.   Curlene Labrum, MD [facility] [address] [place], [place] [address] [phone] (office)

## 2018-09-14 NOTE — Progress Notes (Signed)
[facility]  Called Enrigue Catena, significant other. He will need to stay with her for 24 hours if she goes or be able to watch kids tomorrow if she remains. I have written him a work note for 8/10 and 8/11. Lenix stays at home with kids normally.   Ok to breastfeed after anesthesia and with [place], MD [facility] [place], [place] [address] [phone] (office)

## 2018-09-14 NOTE — H&P (Signed)
[facility] History and Physical  Reason for Referral: Acute appendicitis  Referring Physician:  Dr. Clarene DukeMcManus   Chief Complaint    Abdominal Pain      Rebekah [name] is a 28 y.o. female.  HPI:  Ms. Rebekah [name] is 28 yo with acute onset of abdominal pain in the last 2 days that started in her mid abdomen and moved to the right lower quadrant. It has been associated with nausea/ vomiting, and she came to the ED as the pain was worsening.  She is otherwise healthy. She is 9 months post partum.  She has never felt pain like this prior. The pain is sharp and constant in nature. The pain is radiating into her back.  Past Medical History:  Diagnosis Date  . Bartholin's cyst   . Chlamydia   . Depression   . Eczema   . Headache   . Migraine   . Vaginal Pap smear, abnormal     Past Surgical History:  Procedure Laterality Date  . WISDOM TOOTH EXTRACTION      Family History  Problem Relation Age of Onset  . Cancer Mother        lung    Social History   Tobacco Use  . Smoking status: Never Smoker  . Smokeless tobacco: Never Used  Substance Use Topics  . Alcohol use: No  . Drug use: Not Currently    Types: Marijuana    Medications: I have reviewed the patient's current medications. Current Facility-Administered Medications  Medication Dose Route Frequency Provider Last Rate Last Dose  . 0.9 %  sodium chloride infusion   Intravenous Continuous Samuel JesterMcManus, Kathleen, DO      . metroNIDAZOLE (FLAGYL) IVPB 500 mg  500 mg Intravenous Once Samuel JesterMcManus, Kathleen, DO       Current Outpatient Medications  Medication Sig Dispense Refill Last Dose  . acetaminophen (TYLENOL) 500 MG tablet Take 500 mg by mouth every 6 (six) hours as needed for mild pain.   09/13/2018 at Unknown time  . norethindrone (ORTHO MICRONOR) 0.35 MG tablet Take 1 tablet (0.35 mg total) by mouth daily. 1 Package 11 09/13/2018 at Unknown time   No Known Allergies   ROS:  A comprehensive review of  systems was negative except for: Gastrointestinal: positive for abdominal pain, nausea and vomiting  Blood pressure 103/74, pulse 81, temperature 98.3 F (36.8 C), temperature source Oral, resp. rate 14, last menstrual period 09/14/2018, SpO2 100 %, currently breastfeeding. Physical Exam Vitals signs reviewed.  Constitutional:      Appearance: She is well-developed.  HENT:     Head: Normocephalic.  Eyes:     Extraocular Movements: Extraocular movements intact.  Cardiovascular:     Rate and Rhythm: Normal rate.  Pulmonary:     Effort: Pulmonary effort is normal.  Abdominal:     General: Abdomen is flat.     Palpations: Abdomen is soft.     Tenderness: There is abdominal tenderness in the right lower quadrant. There is no guarding or rebound. Positive signs include psoas sign and obturator sign.     Hernia: A hernia is present. Hernia is present in the umbilical area.  Skin:    General: Skin is warm and dry.  Neurological:     General: No focal deficit present.     Mental Status: She is alert and oriented to person, place, and time.  Psychiatric:        Mood and Affect: Mood normal.  Behavior: Behavior normal.     Results: Results for orders placed or performed during the hospital encounter of 09/14/18 (from the past 48 hour(s))  Comprehensive metabolic panel     Status: Abnormal   Collection Time: 09/14/18  8:18 AM  Result Value Ref Range   Sodium 138 135 - 145 mmol/L   Potassium 3.7 3.5 - 5.1 mmol/L   Chloride 100 98 - 111 mmol/L   CO2 24 22 - 32 mmol/L   Glucose, Bld 129 (H) 70 - 99 mg/dL   BUN 8 6 - 20 mg/dL   Creatinine, Ser 0.63 0.44 - 1.00 mg/dL   Calcium 9.7 8.9 - 10.3 mg/dL   Total Protein 8.6 (H) 6.5 - 8.1 g/dL   Albumin 5.4 (H) 3.5 - 5.0 g/dL   AST 18 15 - 41 U/L   ALT 14 0 - 44 U/L   Alkaline Phosphatase 97 38 - 126 U/L   Total Bilirubin 1.4 (H) 0.3 - 1.2 mg/dL   GFR calc non Af Amer >60 >60 mL/min   GFR calc Af Amer >60 >60 mL/min   Anion gap 14 5  - 15    Comment: Performed at [facility], [address] [place], [place] [address]  Lipase, blood     Status: None   Collection Time: 09/14/18  8:18 AM  Result Value Ref Range   Lipase 28 11 - 51 U/L    Comment: Performed at [facility], [address], [place], [place] [address]  CBC with Differential     Status: Abnormal   Collection Time: 09/14/18  8:18 AM  Result Value Ref Range   WBC 14.0 (H) 4.0 - 10.5 K/uL   RBC 4.88 3.87 - 5.11 MIL/uL   Hemoglobin 14.3 12.0 - 15.0 g/dL   HCT 43.9 36.0 - 46.0 %   MCV 90.0 80.0 - 100.0 fL   MCH 29.3 26.0 - 34.0 pg   MCHC 32.6 30.0 - 36.0 g/dL   RDW 13.1 11.5 - 15.5 %   Platelets 222 150 - 400 K/uL   nRBC 0.0 0.0 - 0.2 %   Neutrophils Relative % 92 %   Neutro Abs 12.7 (H) 1.7 - 7.7 K/uL   Lymphocytes Relative 5 %   Lymphs Abs 0.7 0.7 - 4.0 K/uL   Monocytes Relative 3 %   Monocytes Absolute 0.5 0.1 - 1.0 K/uL   Eosinophils Relative 0 %   Eosinophils Absolute 0.0 0.0 - 0.5 K/uL   Basophils Relative 0 %   Basophils Absolute 0.0 0.0 - 0.1 K/uL   Immature Granulocytes 0 %   Abs Immature Granulocytes 0.06 0.00 - 0.07 K/uL    Comment: Performed at [facility], [address], [place], [place] [address]  hCG, quantitative, pregnancy     Status: None   Collection Time: 09/14/18  8:18 AM  Result Value Ref Range   hCG, Beta Chain, Quant, S <1 <5 mIU/mL    Comment:          GEST. AGE      CONC.  (mIU/mL)   <=1 WEEK        5 - 50     2 WEEKS       50 - 500     3 WEEKS       100 - 10,000     4 WEEKS     1,000 - 30,000     5 WEEKS     3,500 - 115,000   6-8 WEEKS  12,000 - 270,000    12 WEEKS     15,000 - 220,000        FEMALE AND NON-PREGNANT FEMALE:     LESS THAN 5 mIU/mL Performed at [facility], [address] [place], [place] [address]   Urinalysis, Routine w reflex microscopic     Status: Abnormal   Collection Time: 09/14/18  8:20 AM  Result Value Ref Range   Color, Urine YELLOW YELLOW   APPearance HAZY (A) CLEAR   Specific  Gravity, Urine 1.018 1.005 - 1.030   pH 8.0 5.0 - 8.0   Glucose, UA NEGATIVE NEGATIVE mg/dL   Hgb urine dipstick NEGATIVE NEGATIVE   Bilirubin Urine NEGATIVE NEGATIVE   Ketones, ur 20 (A) NEGATIVE mg/dL   Protein, ur NEGATIVE NEGATIVE mg/dL   Nitrite NEGATIVE NEGATIVE   Leukocytes,Ua NEGATIVE NEGATIVE    Comment: Performed at [facility], [address] [place], [place] [address]  Pregnancy, urine     Status: None   Collection Time: 09/14/18  8:20 AM  Result Value Ref Range   Preg Test, Ur NEGATIVE NEGATIVE    Comment:        THE SENSITIVITY OF THIS METHODOLOGY IS >20 mIU/mL. Performed at [facility], [address] [place], [place] [address]    CT with dilated appendix, base looks like it is healthy, reviewed with radiology, significant edema around, retrocecal location   Dg Chest 2 View  Result Date: 09/14/2018 CLINICAL DATA:  Abdominal pain EXAM: CHEST - 2 VIEW COMPARISON:  None. FINDINGS: The heart size and mediastinal contours are within normal limits. Both lungs are clear. The visualized skeletal structures are unremarkable. IMPRESSION: No active cardiopulmonary disease. Electronically Signed   By: Duanne GuessNicholas  Plundo M.D.   On: 09/14/2018 10:24   Ct Abdomen Pelvis W Contrast  Result Date: 09/14/2018 CLINICAL DATA:  Abdominal pain with nausea EXAM: CT ABDOMEN AND PELVIS WITH CONTRAST TECHNIQUE: Multidetector CT imaging of the abdomen and pelvis was performed using the standard protocol following bolus administration of intravenous contrast. CONTRAST:  75mL OMNIPAQUE IOHEXOL 300 MG/ML  SOLN COMPARISON:  None. FINDINGS: Lower chest: Lung bases are clear. Hepatobiliary: No focal liver lesions are evident. There is mild periportal edema. Gallbladder wall is not appreciably thickened. There is no appreciable biliary duct dilatation. Pancreas: There is no pancreatic mass or inflammatory focus. Spleen: No splenic lesions evident. Adrenals/Urinary Tract: Adrenals bilaterally appear normal.  Kidneys bilaterally show no evident mass or hydronephrosis on either side. There is no evident renal or ureteral calculus on either side. Urinary bladder is midline with wall thickness within normal limits. Stomach/Bowel: There is moderate stool throughout the colon. There is no appreciable bowel wall or mesenteric thickening. There is no evident bowel obstruction. The terminal ileum appears unremarkable. No free air or portal venous air. Vascular/Lymphatic: No abdominal aortic aneurysm. No vascular lesions evident. No adenopathy appreciable in the abdomen or pelvis. Reproductive: The uterus is anteverted. There is no appreciable pelvic mass. There is moderate fluid in the cul-de-sac region, however. Other: The appendiceal wall is thickened to 1.4 cm. There is enhancement of the appendiceal wall. There is fluid surrounding the appendix. There is no frank abscess or perforation appreciable. Elsewhere there is no abscess in the abdomen or pelvis. Musculoskeletal: No blastic or lytic bone lesions. No intramuscular or abdominal wall lesion. IMPRESSION: 1.  Findings indicative of acute appendicitis. Appendix: Location: Appendix arises from the cecum and extends inferior and slightly lateral to the cecum in the upper to mid  pelvis on the right. Diameter: Up to 14 mm Appendicolith: None Mucosal hyper-enhancement: Present Extraluminal gas: None Periappendiceal collection: Moderate period no frank abscess. 2. There is mild periportal edema of uncertain etiology. Question parenchymal liver disease as cause. Appropriate laboratory correlation in this regard advised. 3. Moderate free fluid in the cul-de-sac. Question fluid secondary to the appendicitis versus recent ovarian cyst rupture. 4.  No bowel obstruction.  No abscess in the abdomen or pelvis. 5. No renal or ureteral calculus. No hydronephrosis. Urinary bladder wall thickness normal. Critical Value/emergent results were called by telephone at the time of interpretation  on 09/14/2018 at 12:29 pm to Dr. Samuel JesterKATHLEEN MCMANUS , who verbally acknowledged these results. Electronically Signed   By: Bretta BangWilliam  Woodruff III M.D.   On: 09/14/2018 12:29     Assessment & Plan:  Rebekah [name] is a 28 y.o. female with acute appendicitis. Having pain for about 2 days.  -Lap appy for appendicitis  -Post partum 9 months and breast feeds   All questions were answered to the satisfaction of the patient.   Lucretia RoersLindsay C Bridges 09/14/2018, 2:02 PM

## 2018-09-14 NOTE — ED Triage Notes (Signed)
Pt c/o of epigastric abdominal pain with nausea x 2 days. Afebrile.

## 2018-09-14 NOTE — ED Provider Notes (Signed)
[facility] EMERGENCY DEPARTMENT Provider Note   CSN: [id number] Arrival date & time: 09/14/18  [id number]     History   Chief Complaint Chief Complaint  Patient presents with   Abdominal Pain    HPI Rebekah Songicole [name] is a 28 y.o. female.     HPI  Pt was seen at 0735.  Per pt, c/o gradual onset and persistence of constant mid-epigastric and now generalized abd "pain" since yesterday.  Has been associated with multiple intermittent episodes of N/V.  Describes the abd pain as "it's getting worse." Pt states her symptoms may have started after eating, but she is unsure. Denies sick contacts.  Denies diarrhea, no fevers, no back pain, no rash, no CP/SOB, no black or blood in stools or emesis.      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 Authorizing Provider  acetaminophen (TYLENOL) 500 MG tablet Take 500 mg by mouth every 6 (six) hours as needed for mild pain.    [provider]  norethindrone (ORTHO MICRONOR) 0.35 MG tablet Take 1 tablet (0.35 mg total) by mouth daily. 12/16/17 12/16/18  Allayne StackBeard, Samantha N, DO    Family History Family History  Problem Relation Age of Onset   Cancer Mother        lung     Social History Social History   Tobacco Use   Smoking status: Never Smoker   Smokeless tobacco: Never Used  Substance Use Topics   Alcohol use: No   Drug use: Not Currently    Types: Marijuana     Allergies   Patient has no known allergies.   Review of Systems Review of Systems ROS: Statement: All systems negative except as marked or noted in the HPI; Constitutional: Negative for fever and chills. ; ; Eyes: Negative for eye pain, redness and discharge. ; ; ENMT: Negative for ear pain, hoarseness, nasal congestion, sinus pressure and sore throat. ; ; Cardiovascular: Negative for chest pain, palpitations, diaphoresis, dyspnea and peripheral edema. ; ; Respiratory: Negative for cough, wheezing and stridor. ; ; Gastrointestinal: +N/V, abd pain. Negative for diarrhea, blood in stool, hematemesis, jaundice and rectal bleeding. . ; ; Genitourinary: Negative for dysuria, flank pain and hematuria. ; ; Musculoskeletal: Negative for back pain and neck pain. Negative for swelling and trauma.; ; Skin: Negative for pruritus, rash, abrasions, blisters, bruising and skin lesion.; ;  Neuro: Negative for headache, lightheadedness and neck stiffness. Negative for weakness, altered level of consciousness, altered mental status, extremity weakness, paresthesias, involuntary movement, seizure and syncope.       Physical Exam Updated Vital Signs BP (!) 136/94    Pulse 63    Temp 98.3 F (36.8 C) (Oral)    Resp 18    SpO2 100%   Physical Exam 0740: Physical examination:  Nursing notes reviewed; Vital signs and O2 SAT reviewed;  Constitutional: Well developed, Well nourished, Well hydrated, In no acute distress; Head:  Normocephalic, atraumatic; Eyes: EOMI, PERRL, No scleral icterus; ENMT: Mouth and pharynx normal, Mucous membranes moist; Neck: Supple, Full range of motion, No lymphadenopathy; Cardiovascular: Regular rate and rhythm, No gallop; Respiratory: Breath sounds clear & equal bilaterally, No  wheezes.  Speaking full sentences with ease, Normal respiratory effort/excursion; Chest: Nontender, Movement normal; Abdomen: Soft, +diffuse tenderness to palp. No rebound or guarding. Nondistended, Normal bowel sounds; Genitourinary: No CVA tenderness; Extremities: Peripheral pulses normal, No tenderness, No edema, No calf edema or asymmetry.; Neuro: AA&Ox3, Major CN grossly intact.  Speech clear. No gross focal motor or sensory deficits in extremities.; Skin: Color normal, Warm, Dry.   ED Treatments / Results  Labs (all labs ordered are listed, but only abnormal results are displayed)   EKG None  Radiology   Procedures Procedures (including critical care time)  Medications Ordered in ED Medications  famotidine (PEPCID) IVPB 20 mg premix (has no administration in time range)  ondansetron (ZOFRAN) injection 4 mg (4 mg Intravenous Given 09/14/18 0817)  sodium chloride 0.9 % bolus 1,000 mL (has no administration in time range)     Initial Impression / Assessment and Plan / ED Course  I have reviewed the triage vital signs and the nursing notes.  Pertinent labs & imaging results that were available during my care of the patient were reviewed by me and considered in my medical decision making (see chart for details).     MDM Reviewed: previous chart, nursing note and vitals Reviewed previous: labs Interpretation: labs, x-ray and CT scan    Results for orders placed or performed during the hospital encounter of 09/14/18  Comprehensive metabolic panel  Result Value Ref Range   Sodium 138 135 - 145 mmol/L   Potassium 3.7 3.5 - 5.1 mmol/L   Chloride 100 98 - 111 mmol/L   CO2 24 22 - 32 mmol/L   Glucose, Bld 129 (H) 70 - 99 mg/dL   BUN 8 6 - 20 mg/dL   Creatinine, Ser 0.980.63 0.44 - 1.00 mg/dL   Calcium 9.7 8.9 - 11.910.3 mg/dL   Total Protein 8.6 (H) 6.5 - 8.1 g/dL   Albumin 5.4 (H) 3.5 - 5.0 g/dL   AST 18 15 - 41 U/L   ALT 14 0 - 44 U/L   Alkaline Phosphatase 97 38 - 126 U/L    Total Bilirubin 1.4 (H) 0.3 - 1.2 mg/dL   GFR calc non Af Amer >60 >60 mL/min   GFR calc Af Amer >60 >60 mL/min   Anion gap 14 5 - 15  Lipase, blood  Result Value Ref Range   Lipase 28 11 - 51 U/L  CBC with Differential  Result Value Ref Range   WBC 14.0 (H) 4.0 - 10.5 K/uL   RBC 4.88 3.87 - 5.11 MIL/uL   Hemoglobin 14.3 12.0 - 15.0 g/dL   HCT 14.743.9 82.936.0 - 56.246.0 %   MCV 90.0 80.0 - 100.0 fL   MCH 29.3  26.0 - 34.0 pg   MCHC 32.6 30.0 - 36.0 g/dL   RDW 81.113.1 91.411.5 - 78.215.5 %   Platelets 222 150 - 400 K/uL   nRBC 0.0 0.0 - 0.2 %   Neutrophils Relative % 92 %   Neutro Abs 12.7 (H) 1.7 - 7.7 K/uL   Lymphocytes Relative 5 %   Lymphs Abs 0.7 0.7 - 4.0 K/uL   Monocytes Relative 3 %   Monocytes Absolute 0.5 0.1 - 1.0 K/uL   Eosinophils Relative 0 %   Eosinophils Absolute 0.0 0.0 - 0.5 K/uL   Basophils Relative 0 %   Basophils Absolute 0.0 0.0 - 0.1 K/uL   Immature Granulocytes 0 %   Abs Immature Granulocytes 0.06 0.00 - 0.07 K/uL  Urinalysis, Routine w reflex microscopic  Result Value Ref Range   Color, Urine YELLOW YELLOW   APPearance HAZY (A) CLEAR   Specific Gravity, Urine 1.018 1.005 - 1.030   pH 8.0 5.0 - 8.0   Glucose, UA NEGATIVE NEGATIVE mg/dL   Hgb urine dipstick NEGATIVE NEGATIVE   Bilirubin Urine NEGATIVE NEGATIVE   Ketones, ur 20 (A) NEGATIVE mg/dL   Protein, ur NEGATIVE NEGATIVE mg/dL   Nitrite NEGATIVE NEGATIVE   Leukocytes,Ua NEGATIVE NEGATIVE  hCG, quantitative, pregnancy  Result Value Ref Range   hCG, Beta Chain, Quant, S <1 <5 mIU/mL  Pregnancy, urine  Result Value Ref Range   Preg Test, Ur NEGATIVE NEGATIVE   Dg Chest 2 View Result Date: 09/14/2018 CLINICAL DATA:  Abdominal pain EXAM: CHEST - 2 VIEW COMPARISON:  None. FINDINGS: The heart size and mediastinal contours are within normal limits. Both lungs are clear. The visualized skeletal structures are unremarkable. IMPRESSION: No active cardiopulmonary disease. Electronically Signed   By: Duanne GuessNicholas  Plundo M.D.    On: 09/14/2018 10:24   Ct Abdomen Pelvis W Contrast Result Date: 09/14/2018 CLINICAL DATA:  Abdominal pain with nausea EXAM: CT ABDOMEN AND PELVIS WITH CONTRAST TECHNIQUE: Multidetector CT imaging of the abdomen and pelvis was performed using the standard protocol following bolus administration of intravenous contrast. CONTRAST:  75mL OMNIPAQUE IOHEXOL 300 MG/ML  SOLN COMPARISON:  None. FINDINGS: Lower chest: Lung bases are clear. Hepatobiliary: No focal liver lesions are evident. There is mild periportal edema. Gallbladder wall is not appreciably thickened. There is no appreciable biliary duct dilatation. Pancreas: There is no pancreatic mass or inflammatory focus. Spleen: No splenic lesions evident. Adrenals/Urinary Tract: Adrenals bilaterally appear normal. Kidneys bilaterally show no evident mass or hydronephrosis on either side. There is no evident renal or ureteral calculus on either side. Urinary bladder is midline with wall thickness within normal limits. Stomach/Bowel: There is moderate stool throughout the colon. There is no appreciable bowel wall or mesenteric thickening. There is no evident bowel obstruction. The terminal ileum appears unremarkable. No free air or portal venous air. Vascular/Lymphatic: No abdominal aortic aneurysm. No vascular lesions evident. No adenopathy appreciable in the abdomen or pelvis. Reproductive: The uterus is anteverted. There is no appreciable pelvic mass. There is moderate fluid in the cul-de-sac region, however. Other: The appendiceal wall is thickened to 1.4 cm. There is enhancement of the appendiceal wall. There is fluid surrounding the appendix. There is no frank abscess or perforation appreciable. Elsewhere there is no abscess in the abdomen or pelvis. Musculoskeletal: No blastic or lytic bone lesions. No intramuscular or abdominal wall lesion. IMPRESSION: 1.  Findings indicative of acute appendicitis. Appendix: Location: Appendix arises from the cecum and  extends inferior and slightly lateral to the  cecum in the upper to mid pelvis on the right. Diameter: Up to 14 mm Appendicolith: None Mucosal hyper-enhancement: Present Extraluminal gas: None Periappendiceal collection: Moderate period no frank abscess. 2. There is mild periportal edema of uncertain etiology. Question parenchymal liver disease as cause. Appropriate laboratory correlation in this regard advised. 3. Moderate free fluid in the cul-de-sac. Question fluid secondary to the appendicitis versus recent ovarian cyst rupture. 4.  No bowel obstruction.  No abscess in the abdomen or pelvis. 5. No renal or ureteral calculus. No hydronephrosis. Urinary bladder wall thickness normal. Critical Value/emergent results were called by telephone at the time of interpretation on 09/14/2018 at 12:29 pm to Dr. Francine Graven , who verbally acknowledged these results. Electronically Signed   By: Lowella Grip III M.D.   On: 09/14/2018 12:29    Rebekah [name] was evaluated in Emergency Department on 09/14/2018 for the symptoms described in the history of present illness. She was evaluated in the context of the global COVID-19 pandemic, which necessitated consideration that the patient might be at risk for infection with the SARS-CoV-2 virus that causes COVID-19. Institutional protocols and algorithms that pertain to the evaluation of patients at risk for COVID-19 are in a state of rapid change based on information released by regulatory bodies including the CDC and federal and state organizations. These policies and algorithms were followed during the patient's care in the ED.   1235:  CT as above. Pt remains NPO, IVF NS infusing, IV abx started.  Dx and testing d/w pt.  Questions answered.  Verb understanding, agreeable to admit. T/C returned from General Surgery Dr. Constance Haw, case discussed, including:  HPI, pertinent PM/SHx, VS/PE, dx testing, ED course and treatment:  Agreeable to admit.    Final Clinical  Impressions(s) / ED Diagnoses   Final diagnoses:  None    ED Discharge Orders    None       Francine Graven, DO 09/17/18 1526

## 2018-09-14 NOTE — ED Notes (Signed)
Returned from [facility] with Faith(Radiology)

## 2018-09-14 NOTE — Anesthesia Preprocedure Evaluation (Signed)
Anesthesia Evaluation  Patient identified by MRN, date of birth, ID band Patient awake    Reviewed: Allergy & Precautions, NPO status , Patient's Chart, lab work & pertinent test results  Airway Mallampati: II  TM Distance: >3 FB Neck ROM: Full    Dental no notable dental hx. (+) Teeth Intact   Pulmonary neg pulmonary ROS,    Pulmonary exam normal breath sounds clear to auscultation       Cardiovascular Exercise Tolerance: Good negative cardio ROS Normal cardiovascular examI Rhythm:Regular Rate:Normal     Neuro/Psych  Headaches, Anxiety Depression negative psych ROS   GI/Hepatic negative GI ROS, Neg liver ROS,   Endo/Other  negative endocrine ROS  Renal/GU negative Renal ROS  negative genitourinary   Musculoskeletal negative musculoskeletal ROS (+)   Abdominal   Peds negative pediatric ROS (+)  Hematology negative hematology ROS (+)   Anesthesia Other Findings   Reproductive/Obstetrics negative OB ROS                             Anesthesia Physical Anesthesia Plan  ASA: I and emergent  Anesthesia Plan: General   Post-op Pain Management:    Induction: Intravenous  PONV Risk Score and Plan: 3 and Dexamethasone, Ondansetron and Treatment may vary due to age or medical condition  Airway Management Planned: Oral ETT  Additional Equipment:   Intra-op Plan:   Post-operative Plan: Extubation in OR  Informed Consent: I have reviewed the patients History and Physical, chart, labs and discussed the procedure including the risks, benefits and alternatives for the proposed anesthesia with the patient or authorized representative who has indicated his/her understanding and acceptance.     Dental advisory given  Plan Discussed with: CRNA  Anesthesia Plan Comments: (Plan GETA-d/w pt -wtp with same after Q&A Plan Full PPE  covid -)        Anesthesia Quick Evaluation

## 2018-09-14 NOTE — Transfer of Care (Signed)
Immediate Anesthesia Transfer of Care Note  Patient: Rebekah [name]  Procedure(s) Performed: APPENDECTOMY LAPAROSCOPIC (N/A )  Patient Location: PACU  Anesthesia Type:General  Level of Consciousness: awake, alert  and oriented  Airway & Oxygen Therapy: Patient Spontanous Breathing  Post-op Assessment: Report given to RN and Post -op Vital signs reviewed and stable  Post vital signs: Reviewed and stable  Last Vitals:  Vitals Value Taken Time  BP    Temp    Pulse 91 09/14/18 1632  Resp 21 09/14/18 1632  SpO2 96 % 09/14/18 1632  Vitals shown include unvalidated device data.  Last Pain:  Vitals:   09/14/18 1440  TempSrc: Oral  PainSc: 6          Complications: No apparent anesthesia complications

## 2018-09-14 NOTE — Anesthesia Postprocedure Evaluation (Signed)
Anesthesia Post Note  Patient: Rebekah [name]  Procedure(s) Performed: APPENDECTOMY LAPAROSCOPIC (N/A )  Patient location during evaluation: PACU Anesthesia Type: General Level of consciousness: awake, awake and alert, oriented and patient cooperative Pain management: pain level controlled Vital Signs Assessment: post-procedure vital signs reviewed and stable Respiratory status: spontaneous breathing, respiratory function stable and nonlabored ventilation Cardiovascular status: stable Postop Assessment: no apparent nausea or vomiting Anesthetic complications: no     Last Vitals:  Vitals:   09/14/18 1440 09/14/18 1631  BP: 118/77 (P) 115/63  Pulse: 85 (P) 84  Resp: (!) 24   Temp: 37.1 C (P) 37.2 C  SpO2: 97% (P) 94%    Last Pain:  Vitals:   09/14/18 1440  TempSrc: Oral  PainSc: 6                  Kammie Scioli

## 2018-09-14 NOTE — Discharge Instructions (Signed)
Discharge Laparoscopic Surgery Instructions:  Common Complaints: Right shoulder pain is common after laparoscopic surgery. This is secondary to the gas used in the surgery being trapped under the diaphragm.  Walk to help your body absorb the gas. This will improve in a few days. Pain at the port sites are common, especially the larger port sites. This will improve with time.  Some nausea is common and poor appetite. The main goal is to stay hydrated the first few days after surgery.  You can take the narcotic and still breastfeed.  The anesthesia team said it was ok for you to breastfeed after anesthesia. If you notice the baby being sleeper, then you may want to pump and dump for 24 hours or reduce your Norco dosing. This is unlikely.   Diet/ Activity: Diet as tolerated. You may not have an appetite, but it is important to stay hydrated. Drink 64 ounces of water a day. Your appetite will return with time.  Shower per your regular routine daily.  Do not take hot showers. Take warm showers that are less than 10 minutes. Rest and listen to your body, but do not remain in bed all day.  Walk everyday for at least 15-20 minutes. Deep cough and move around every 1-2 hours in the first few days after surgery.  Do not lift heavy > 20-30lbs, perform excessive bending, pushing, pulling, squatting for 1-2 weeks after surgery.  Do not pick at the dermabond glue on your incision sites.  This glue film will remain in place for 1-2 weeks and will start to peel off.  Do not place lotions or balms on your incision unless instructed to specifically by Dr. Constance Haw.   Medication: Take tylenol and ibuprofen as needed for pain control, alternating every 4-6 hours.  Example:  Tylenol 1000mg  @ 6am, 12noon, 6pm, 34midnight (Do not exceed 4000mg  of tylenol a day). Ibuprofen 800mg  @ 9am, 3pm, 9pm, 3am (Do not exceed 3600mg  of ibuprofen a day).  Take Norco for breakthrough pain every 4 hours.  Take Colace for  constipation related to narcotic pain medication. If you do not have a bowel movement in 2 days, take Miralax over the counter.  Drink plenty of water to also prevent constipation.   Contact Information: If you have questions or concerns, please call our office, [phone], Monday- Thursday 8AM-5PM and Friday 8AM-12Noon.  If it is after hours or on the weekend, please call [facility], [phone], and ask to speak to the surgeon on call for Dr. Constance Haw at [facility].      Laparoscopic Appendectomy, Adult, Care After This sheet gives you information about how to care for yourself after your procedure. Your doctor may also give you more specific instructions. If you have problems or questions, contact your doctor. What can I expect after the procedure? After the procedure, it is common to have:  Little energy for normal activities.  Mild pain in the area where the cuts from surgery (incisions) were made.  Trouble pooping (constipation). This can be caused by: ? Pain medicine. ? A lack of activity. Follow these instructions at home: Medicines  Take over-the-counter and prescription medicines only as told by your doctor.  If you were prescribed an antibiotic medicine, take it as told by your doctor. Do not stop taking it even if you start to feel better.  Do not drive or use heavy machinery while taking prescription pain medicine.  Ask your doctor if the medicine you are taking can cause trouble  pooping. You may need to take steps to prevent or treat trouble pooping: ? Drink enough fluid to keep your pee (urine) pale yellow. ? Take over-the-counter or prescription medicines. ? Eat foods that are high in fiber. These include beans, whole grains, and fresh fruits and vegetables. ? Limit foods that are high in fat and sugar. These include fried or sweet foods. Incision care   Follow instructions from your doctor about how to take care of your cuts from surgery. Make sure  you: ? Wash your hands with soap and water before and after you change your bandage (dressing). If you cannot use soap and water, use hand sanitizer. ? Change your bandage as told by your doctor. ? Leave stitches (sutures), skin glue, or skin tape (adhesive) strips in place. They may need to stay in place for 2 weeks or longer. If tape strips get loose and curl up, you may trim the loose edges. Do not remove tape strips completely unless your doctor says it is okay.  Check your cuts from surgery every day for signs of infection. Check for: ? Redness, swelling, or pain. ? Fluid or blood. ? Warmth. ? Pus or a bad smell. Bathing  Keep your cuts from surgery clean and dry. Clean them as told by your doctor. To do this: 1. Gently wash the cuts with soap and water. 2. Rinse the cuts with water to remove all soap. 3. Pat the cuts dry with a clean towel. Do not rub the cuts.  Do not take baths, swim, or use a hot tub for 2 weeks, or until your doctor says it is okay.   You may shower.  Activity   Do not drive for 24 hours if you were given a medicine to help you relax (sedative) during your procedure.  Rest after the procedure. Return to your normal activities as told by your doctor. Ask your doctor what activities are safe for you.  For 3 weeks, or for as long as told by your doctor: ? Do not lift anything that is heavier than 10 lb (4.5 kg), or the limit that you are told. ? Do not play contact sports. General instructions  If you were sent home with a drain, follow instructions from your doctor on how to care for it.  Take deep breaths. This helps to keep your lungs from getting an infection (pneumonia).  Keep all follow-up visits as told by your doctor. This is important. Contact a doctor if:  You have redness, swelling, or pain around a cut from surgery.  You have fluid or blood coming from a cut.  Your cut feels warm to the touch.  You have pus or a bad smell coming from a  cut or a bandage.  The edges of a cut break open after the stitches have been taken out.  You have pain in your shoulders that gets worse.  You feel dizzy or you pass out (faint).  You have shortness of breath.  You keep feeling sick to your stomach (nauseous).  You keep throwing up (vomiting).  You get watery poop (diarrhea) or you cannot control your poop.  You lose your appetite.  You have swelling or pain in your legs.  You get a rash. Get help right away if:  You have a fever.  You have trouble breathing.  You have sharp pains in your chest. Summary  After the procedure, it is common to have low energy, mild pain, and trouble pooping.  Infection  is a common problem after this procedure. Follow your doctor's instructions about caring for yourself after the procedure.  Rest after the procedure. Return to your normal activities as told by your doctor.  Contact your doctor if you see signs of infection around your cuts from surgery, or you get short of breath. Get help right away if you have a fever, chest pain, or trouble breathing. This information is not intended to replace advice given to you by your health care provider. Make sure you discuss any questions you have with your health care provider. Document Released: 11/17/2008 Document Revised: 07/24/2017 Document Reviewed: 07/24/2017 Elsevier Patient Education  2020 [employer]   General Anesthesia, Adult, Care After This sheet gives you information about how to care for yourself after your procedure. Your health care provider may also give you more specific instructions. If you have problems or questions, contact your health care provider. What can I expect after the procedure? After the procedure, the following side effects are common:  Pain or discomfort at the IV site.  Nausea.  Vomiting.  Sore throat.  Trouble concentrating.  Feeling cold or chills.  Weak or tired.  Sleepiness and  fatigue.  Soreness and body aches. These side effects can affect parts of the body that were not involved in surgery. Follow these instructions at home:  For at least 24 hours after the procedure:  Have a responsible adult stay with you. It is important to have someone help care for you until you are awake and alert.  Rest as needed.  Do not: ? Participate in activities in which you could fall or become injured. ? Drive. ? Use heavy machinery. ? Drink alcohol. ? Take sleeping pills or medicines that cause drowsiness. ? Make important decisions or sign legal documents. ? Take care of children on your own. Eating and drinking  Follow any instructions from your health care provider about eating or drinking restrictions.  When you feel hungry, start by eating small amounts of foods that are soft and easy to digest (bland), such as toast. Gradually return to your regular diet.  Drink enough fluid to keep your urine pale yellow.  If you vomit, rehydrate by drinking water, juice, or clear broth. General instructions  If you have sleep apnea, surgery and certain medicines can increase your risk for breathing problems. Follow instructions from your health care provider about wearing your sleep device: ? Anytime you are sleeping, including during daytime naps. ? While taking prescription pain medicines, sleeping medicines, or medicines that make you drowsy.  Return to your normal activities as told by your health care provider. Ask your health care provider what activities are safe for you.  Take over-the-counter and prescription medicines only as told by your health care provider.  If you smoke, do not smoke without supervision.  Keep all follow-up visits as told by your health care provider. This is important. Contact a health care provider if:  You have nausea or vomiting that does not get better with medicine.  You cannot eat or drink without vomiting.  You have pain that  does not get better with medicine.  You are unable to pass urine.  You develop a skin rash.  You have a fever.  You have redness around your IV site that gets worse. Get help right away if:  You have difficulty breathing.  You have chest pain.  You have blood in your urine or stool, or you vomit blood. Summary  After the procedure,  it is common to have a sore throat or nausea. It is also common to feel tired.  Have a responsible adult stay with you for the first 24 hours after general anesthesia. It is important to have someone help care for you until you are awake and alert.  When you feel hungry, start by eating small amounts of foods that are soft and easy to digest (bland), such as toast. Gradually return to your regular diet.  Drink enough fluid to keep your urine pale yellow.  Return to your normal activities as told by your health care provider. Ask your health care provider what activities are safe for you. This information is not intended to replace advice given to you by your health care provider. Make sure you discuss any questions you have with your health care provider. Document Released: 04/29/2000 Document Revised: 01/24/2017 Document Reviewed: 09/06/2016 Elsevier Patient Education  2020 [employer]

## 2018-09-14 NOTE — Anesthesia Procedure Notes (Signed)
Procedure Name: Intubation Date/Time: 09/14/2018 2:54 PM Performed by: Jonna Munro, CRNA Pre-anesthesia Checklist: Patient identified, Emergency Drugs available, Suction available, Patient being monitored and Timeout performed Patient Re-evaluated:Patient Re-evaluated prior to induction Oxygen Delivery Method: Circle system utilized Preoxygenation: Pre-oxygenation with 100% oxygen Induction Type: IV induction, Rapid sequence and Cricoid Pressure applied Laryngoscope Size: Mac and 3 Grade View: Grade I Tube type: Oral Number of attempts: 1 Airway Equipment and Method: Stylet Placement Confirmation: ETT inserted through vocal cords under direct vision,  positive ETCO2 and breath sounds checked- equal and bilateral Secured at: 21 cm Tube secured with: Tape Dental Injury: Teeth and Oropharynx as per pre-operative assessment

## 2018-09-15 ENCOUNTER — Encounter (HOSPITAL_COMMUNITY): Payer: Self-pay | Admitting: General Surgery

## 2018-09-15 ENCOUNTER — Telehealth: Payer: Self-pay

## 2018-09-15 NOTE — Telephone Encounter (Signed)
Patient called and stated that she is nauseous and vomiting post surgery, unable to get up and move around. MD notified, explained to patient that she needs to stay hydrated or will need to go to ED and be admitted for fluids. Patient expresses understanding.

## 2018-09-29 ENCOUNTER — Telehealth: Payer: Self-pay | Admitting: General Surgery

## 2018-09-29 ENCOUNTER — Telehealth (INDEPENDENT_AMBULATORY_CARE_PROVIDER_SITE_OTHER): Payer: Self-pay | Admitting: General Surgery

## 2018-09-29 ENCOUNTER — Other Ambulatory Visit: Payer: Self-pay

## 2018-09-29 DIAGNOSIS — Z09 Encounter for follow-up examination after completed treatment for conditions other than malignant neoplasm: Secondary | ICD-10-CM

## 2018-09-29 NOTE — Telephone Encounter (Signed)
Virtual visit performed.  Patient doing very well.  Has no complaints.  Was instructed to call us should she have any issues.  Follow-up as needed.

## 2018-10-05 ENCOUNTER — Telehealth (INDEPENDENT_AMBULATORY_CARE_PROVIDER_SITE_OTHER): Payer: Medicaid Other | Admitting: Women's Health

## 2018-10-05 ENCOUNTER — Other Ambulatory Visit: Payer: Self-pay

## 2018-10-05 ENCOUNTER — Encounter: Payer: Self-pay | Admitting: Women's Health

## 2018-10-05 VITALS — Ht 63.0 in

## 2018-10-05 DIAGNOSIS — Z3041 Encounter for surveillance of contraceptive pills: Secondary | ICD-10-CM | POA: Diagnosis not present

## 2018-10-05 MED ORDER — NORETHINDRONE 0.35 MG PO TABS: 1.0000 | ORAL_TABLET | Freq: Every day | ORAL | 3 refills | Status: AC

## 2018-10-05 NOTE — Progress Notes (Signed)
   [facility] VIRTUAL GYN VISIT ENCOUNTER NOTE Patient name: Rebekah [name] MRN [id number]  Date of birth: 1990/06/22  I connected with patient on 10/05/18 at  9:45 AM EDT by telephone (wasn't able to download mychart) and verified that I am speaking with the correct person using two identifiers.  Due to COVID-19 recommendations, pt is not currently in the office.    I discussed the limitations, risks, security and privacy concerns of performing an evaluation and management service by telephone and the availability of in person appointments. I also discussed with the patient that there may be a patient responsible charge related to this service. The patient expressed understanding and agreed to proceed.   Chief Complaint:   change birth control pills  History of Present Illness:   Rebekah [name] is a 28 y.o. G19P2002 Caucasian female being evaluated today for birth control discussion. Currently on micronor, was breastfeeding, no longer nursing. Does have migraines w/ aura. Discussed progestin-only methods, wants to stick w/ micronor, takes it at same time daily.       Patient's last menstrual period was 09/14/2018. The current method of family planning is oral progesterone-only contraceptive.  Last pap 05/28/17. Results were:  LSIL w/ normal colpo Review of Systems:   Pertinent items are noted in HPI Denies fever/chills, dizziness, headaches, visual disturbances, fatigue, shortness of breath, chest pain, abdominal pain, vomiting, abnormal vaginal discharge/itching/odor/irritation, problems with periods, bowel movements, urination, or intercourse unless otherwise stated above.  Pertinent History Reviewed:  Reviewed past medical,surgical, social, obstetrical and family history.  Reviewed problem list, medications and allergies. Physical Assessment:   Vitals:   10/05/18 1017  Height: 5\' 3"  (1.6 m)  Body mass index is 18.78 kg/m.       Physical Examination:   General:  Alert, oriented  and cooperative.   Mental Status: Normal mood and affect perceived. Normal judgment and thought content.  Physical exam deferred due to nature of the encounter  No results found for this or any previous visit (from the past 24 hour(s)).  Assessment & Plan:  1) Contraception management> refilled micronor, take at exact same time daily  2) H/O abnormal pap> needs repeat, schedule today  Meds:  Meds ordered this encounter  Medications  . norethindrone (ORTHO MICRONOR) 0.35 MG tablet    Sig: Take 1 tablet (0.35 mg total) by mouth daily.    Dispense:  3 Package    Refill:  3    Order Specific Question:   Supervising Provider    Answer:   Elonda Husky, LUTHER H [[id number]]    No orders of the defined types were placed in this encounter.   I discussed the assessment and treatment plan with the patient. The patient was provided an opportunity to ask questions and all were answered. The patient agreed with the plan and demonstrated an understanding of the instructions.   The patient was advised to call back or seek an in-person evaluation/go to the ED if the symptoms worsen or if the condition fails to improve as anticipated.  I provided 10 minutes of non-face-to-face time during this encounter.   Return for 1st available, Pap & physical.  Roma Schanz CNM, [facility] 10/05/2018 10:52 AM

## 2018-12-01 ENCOUNTER — Encounter: Payer: Medicaid Other | Admitting: Family Medicine

## 2018-12-08 ENCOUNTER — Other Ambulatory Visit: Payer: Medicaid Other | Admitting: Women's Health

## 2019-04-16 ENCOUNTER — Other Ambulatory Visit: Payer: Self-pay

## 2019-04-16 ENCOUNTER — Other Ambulatory Visit (HOSPITAL_COMMUNITY)
Admission: RE | Admit: 2019-04-16 | Discharge: 2019-04-16 | Disposition: A | Discharge status: Home / Self Care | Payer: Medicaid Other | Source: Ambulatory Visit | Attending: Certified Nurse Midwife | Admitting: Certified Nurse Midwife

## 2019-04-16 ENCOUNTER — Encounter: Payer: Self-pay | Admitting: Certified Nurse Midwife

## 2019-04-16 ENCOUNTER — Ambulatory Visit (INDEPENDENT_AMBULATORY_CARE_PROVIDER_SITE_OTHER): Payer: Medicaid Other | Admitting: Certified Nurse Midwife

## 2019-04-16 VITALS — BP 122/82 | HR 69 | Ht 63.0 in | Wt 101.0 lb

## 2019-04-16 DIAGNOSIS — Z3009 Encounter for other general counseling and advice on contraception: Secondary | ICD-10-CM

## 2019-04-16 DIAGNOSIS — Z0000 Encounter for general adult medical examination without abnormal findings: Secondary | ICD-10-CM | POA: Diagnosis not present

## 2019-04-16 DIAGNOSIS — Z8742 Personal history of other diseases of the female genital tract: Secondary | ICD-10-CM | POA: Diagnosis not present

## 2019-04-16 DIAGNOSIS — Z01419 Encounter for gynecological examination (general) (routine) without abnormal findings: Secondary | ICD-10-CM | POA: Diagnosis present

## 2019-04-16 DIAGNOSIS — N926 Irregular menstruation, unspecified: Secondary | ICD-10-CM | POA: Diagnosis not present

## 2019-04-16 LAB — CBC
HCT: 39.6 % (ref 35.0–45.0)
Hemoglobin: 13.3 g/dL (ref 11.7–15.5)
MCH: 29.7 pg (ref 27.0–33.0)
MCHC: 33.6 g/dL (ref 32.0–36.0)
MCV: 88.4 fL (ref 80.0–100.0)
MPV: 12 fL (ref 7.5–12.5)
Platelets: 194 10*3/uL (ref 140–400)
RBC: 4.48 10*6/uL (ref 3.80–5.10)
RDW: 12.6 % (ref 11.0–15.0)
WBC: 9.6 10*3/uL (ref 3.8–10.8)

## 2019-04-16 LAB — POCT URINE PREGNANCY: Preg Test, Ur: NEGATIVE

## 2019-04-16 MED ORDER — NORETHINDRONE ACETATE 5 MG PO TABS: 5.0000 mg | ORAL_TABLET | Freq: Every day | ORAL | 0 refills | Status: AC

## 2019-04-16 NOTE — Progress Notes (Addendum)
Gynecology Annual Exam   History of Present Illness: Rebekah [name] is a 29 y.o. single female presenting for an annual exam. She is concerned about irregular VB today. She had normal menses in January but has has intermittent bleeding over the last 4 weeks. Some days are little to no bleeding and other days are heavier with clots. She feels cold most of the time. She is sexually active. She is still using a POP and is no longer breastfeeding. She does not want to become pregnant. She has hx of migraine with aura and has avoided estrogen containing contraceptives. She denies dyspareunia. She does not perform self breast exams. There is no notable family history of breast or ovarian cancer in her family. Declines STD screen.  Past Medical History:  Past Medical History:  Diagnosis Date  . Bartholin's cyst   . Chlamydia   . Depression   . Eczema   . Headache   . Migraine with aura   . Vaginal Pap smear, abnormal     Past Surgical History:  Past Surgical History:  Procedure Laterality Date  . LAPAROSCOPIC APPENDECTOMY N/A 09/14/2018   Procedure: APPENDECTOMY LAPAROSCOPIC;  Surgeon: Virl Cagey, MD;  Location: [facility];  Service: General;  Laterality: N/A;  . WISDOM TOOTH EXTRACTION      Gynecologic History:  LMP: Patient's last menstrual period was 03/31/2019. 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 Cancer Mother        lung    Social History:  Social History   Socioeconomic History  . Marital status: Significant Other    Spouse name: Enrigue Catena  . Number of children: 2  . Years of education: 69  . Highest education  level: 12th grade  Occupational History  . Not on file  Tobacco Use  . Smoking status: Never Smoker  . Smokeless tobacco: Never Used  Substance and Sexual Activity  . Alcohol use: No  . Drug use: Not Currently    Types: Marijuana  . Sexual activity: Yes    Birth control/protection: None, Pill  Other Topics Concern  . Not on file  Social History Narrative   Lives with Ysidro Evert- boyfriend 3.5 years   Two children   Hailey: 12.32 years old (2020)   Lanelle Bal: 7 months (2020) Oct birth day for both      No pets in the home   Smoke detectors   Wears sunscreen and seat belt      Diet: Eats fruits veggies meats   Caffeine: 2 cups-coffee or tea-pepsi, most water   Social Determinants of Health   Financial Resource Strain: Low Risk   . Difficulty of Paying Living Expenses: Not very hard  Food Insecurity: No Food Insecurity  . Worried About Charity fundraiser in the Last Year: Never true  . Ran Out of Food in the Last Year: Never true  Transportation Needs: No Transportation Needs  . Lack of Transportation (Medical): No  . Lack of Transportation (Non-Medical): No  Physical Activity: Insufficiently Active  . Days of Exercise per Week: 3 days  . Minutes of Exercise per Session: 30 min  Stress: No Stress Concern Present  . Feeling of Stress : Only a little  Social Connections: Somewhat  Isolated  . Frequency of Communication with Friends and Family: Twice a week  . Frequency of Social Gatherings with Friends and Family: Once a week  . Attends Religious Services: Never  . Active Member of Clubs or Organizations: No  . Attends Banker Meetings: Never  . Marital Status: Living with partner  Intimate Partner Violence: Not At Risk  . Fear of Current or Ex-Partner: No  . Emotionally Abused: No  . Physically Abused: No  . Sexually Abused: No    Allergies:  No Known Allergies  Medications: Prior to Admission medications   Medication Sig Start Date End Date Taking?  Authorizing Provider  acetaminophen (TYLENOL) 500 MG tablet Take 500 mg by mouth every 6 (six) hours as needed for mild pain.   Yes [provider]  norethindrone (ORTHO MICRONOR) 0.35 MG tablet Take 1 tablet (0.35 mg total) by mouth daily. 10/05/18 10/05/19 Yes Booker, Merlene Laughter, CNM  ondansetron (ZOFRAN) 4 MG tablet Take 1 tablet (4 mg total) by mouth daily as needed for nausea or vomiting. Patient not taking: Reported on 10/05/2018 09/14/18 09/14/19  Lucretia Roers, MD    Review of Systems: negative except noted in HPI  Physical Exam Vitals: BP 122/82   Pulse 69   Ht 5\' 3"  (1.6 m)   Wt 101 lb (45.8 kg)   LMP 03/31/2019   Breastfeeding No   BMI 17.89 kg/m  General: NAD HEENT: normocephalic, atraumatic Thyroid: no enlargement, no palpable nodules Pulmonary: Normal rate and effort, CTAB Cardiovascular: RRR Breast: Breast symmetrical, no tenderness, no palpable nodules or masses, no skin or nipple retraction present, no nipple discharge. No axillary or supraclavicular lymphadenopathy. Abdomen: soft, non-tender, non-distended. No hepatomegaly, splenomegaly or masses palpable. No evidence of hernia  Genitourinary:  External: Normal external female genitalia. Normal urethral meatus  Vagina: Normal vaginal mucosa, no evidence of prolapse   Cervix: Grossly normal in appearance, no bleeding  Uterus: Non-enlarged, mobile, normal contour. No CMT  Adnexa: non-enlarged, no masses  Rectal: deferred Extremities: no edema, erythema, or tenderness Neurologic: Grossly intact Psychiatric: mood appropriate, affect full  Female chaperone present for pelvic and breast portions of the physical exam  Results for orders placed or performed in visit on 04/16/19 (from the past 24 hour(s))  POCT urine pregnancy     Status: None   Collection Time: 04/16/19  9:34 AM  Result Value Ref Range   Preg Test, Ur Negative Negative   Assessment:  1. Irregular bleeding   2. Encounter for well woman  exam with routine gynecological exam   3. History of abnormal cervical Pap smear    Plan: Discussed other forms of highly effective progestin only contraception including Liletta IUD and implant, brochures given, pt will call back with decision Start Aygestin for irregular bleeding, needs to use back up method PAP today- hx of LGSIL in 2019 w/o f/u CBC Follow up with GYN prn Follow up with PCP as needed/scheduled  2020, CNM 04/16/2019 10:49 AM

## 2019-04-19 LAB — CYTOLOGY - PAP: Diagnosis: NEGATIVE

## 2019-05-11 ENCOUNTER — Ambulatory Visit: Payer: Medicaid Other | Admitting: Advanced Practice Midwife

## 2019-05-26 ENCOUNTER — Ambulatory Visit: Payer: Medicaid Other | Admitting: Obstetrics and Gynecology

## 2019-11-04 ENCOUNTER — Other Ambulatory Visit: Payer: Medicaid Other

## 2019-11-04 DIAGNOSIS — Z20822 Contact with and (suspected) exposure to covid-19: Secondary | ICD-10-CM

## 2019-11-05 LAB — NOVEL CORONAVIRUS, NAA: SARS-CoV-2, NAA: NOT DETECTED

## 2019-11-05 LAB — SARS-COV-2, NAA 2 DAY TAT

## 2019-11-12 ENCOUNTER — Other Ambulatory Visit: Payer: Medicaid Other

## 2019-11-12 DIAGNOSIS — Z20822 Contact with and (suspected) exposure to covid-19: Secondary | ICD-10-CM

## 2019-11-14 LAB — SARS-COV-2, NAA 2 DAY TAT

## 2019-11-14 LAB — NOVEL CORONAVIRUS, NAA: SARS-CoV-2, NAA: NOT DETECTED

## 2021-02-03 IMAGING — DX CHEST - 2 VIEW
2 series · 2 of 2 positions shown · non-contrast
Comparison: None.

CLINICAL DATA: Abdominal pain

EXAM:
CHEST - 2 VIEW

[chest pa]
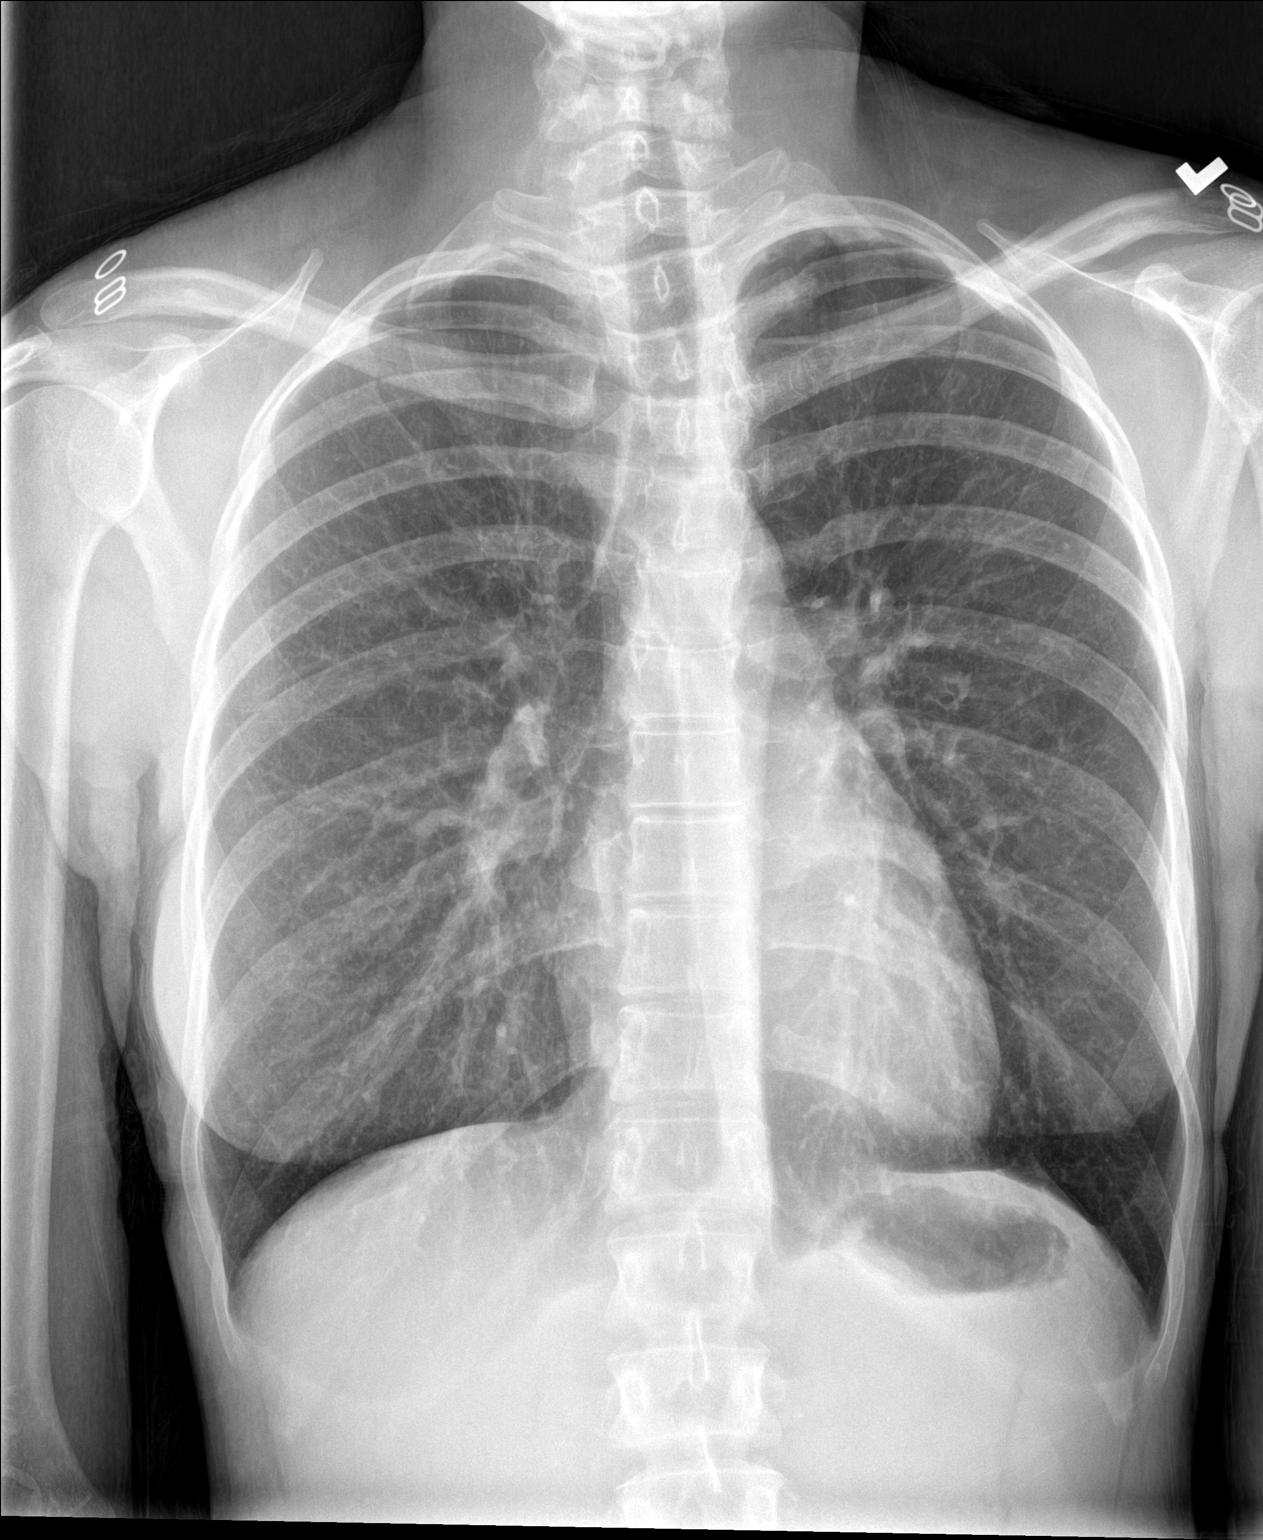

[chest lat]
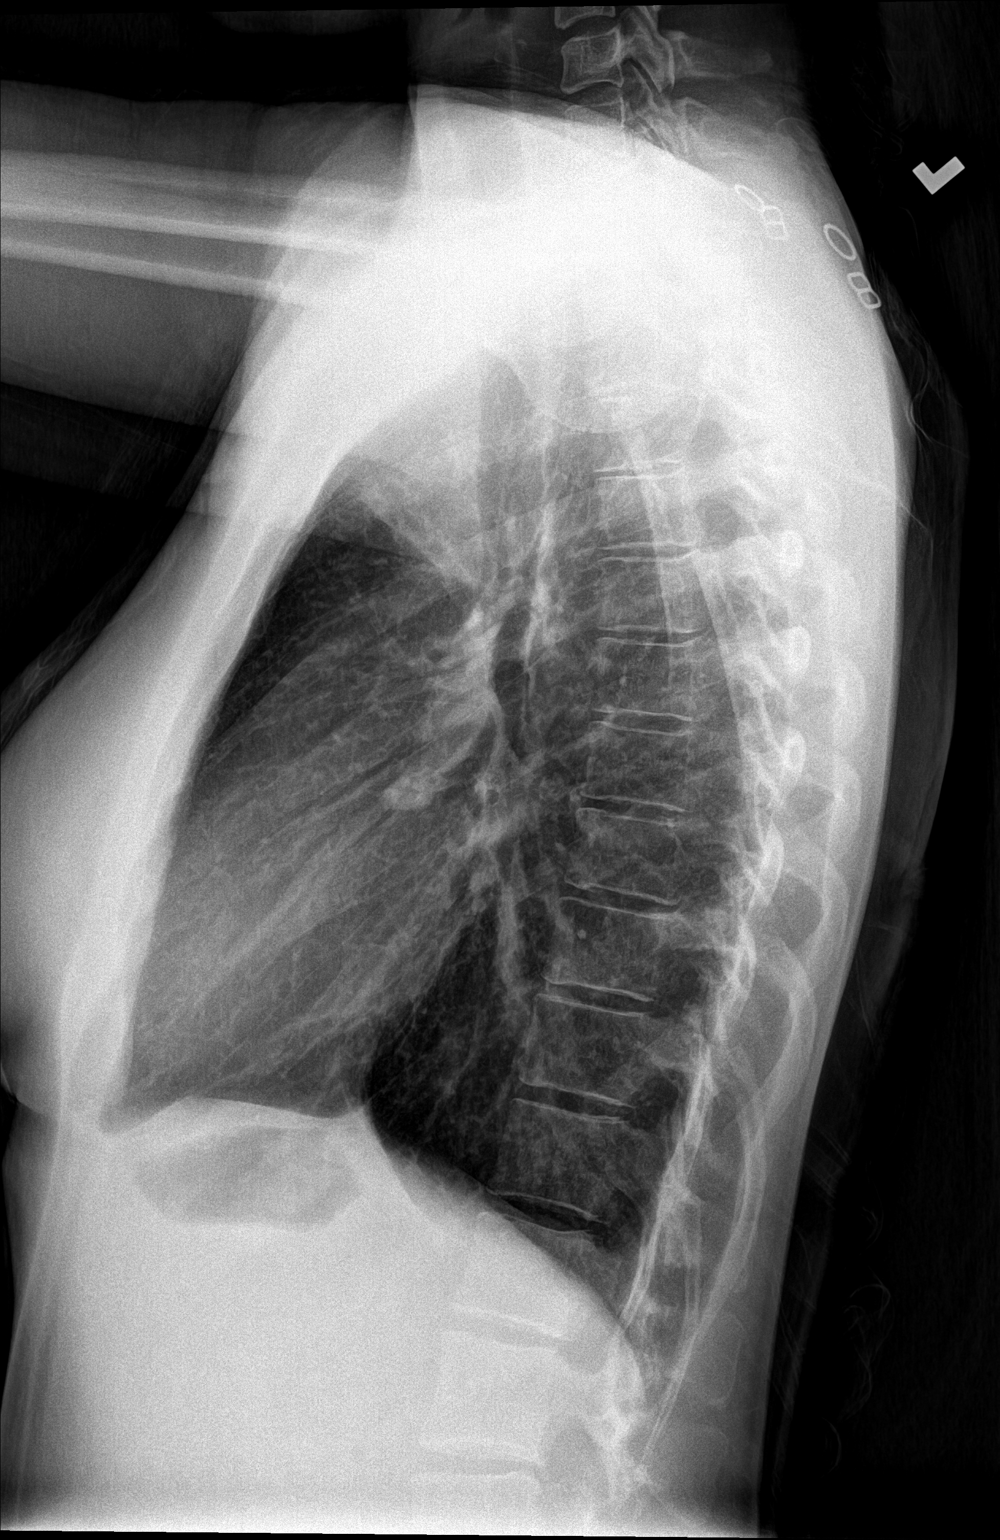

[2 of 2 positions shown; findings below may reference images not displayed]

FINDINGS: The heart size and mediastinal contours are within normal limits.
Both lungs are clear. The visualized skeletal structures are
unremarkable.
IMPRESSION: No active cardiopulmonary disease.

## 2021-02-03 IMAGING — CT CT ABDOMEN AND PELVIS WITH CONTRAST
2 of 4 series · 15 of 46 positions shown, 17 images · IV contrast (Isovue)
Comparison: None.

CLINICAL DATA: Abdominal pain with nausea

EXAM:
CT ABDOMEN AND PELVIS WITH CONTRAST
TECHNIQUE: Multidetector CT imaging of the abdomen and pelvis was performed
using the standard protocol following bolus administration of
intravenous contrast.
CONTRAST:  75mL OMNIPAQUE IOHEXOL 300 MG/ML  SOLN

[Series 2: axial st · axial · 0.60mm/px · z∈[+1022,+1392]mm · 12 of 82 slices shown, 14 images]
[im 4/82  soft-tissue]
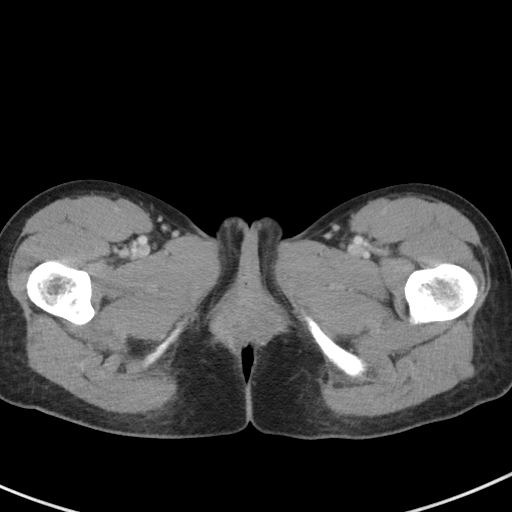
[im 4/82  bone]
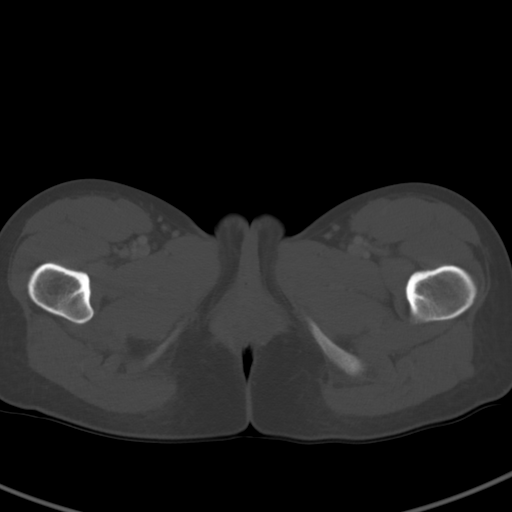
[im 12/82  soft-tissue]
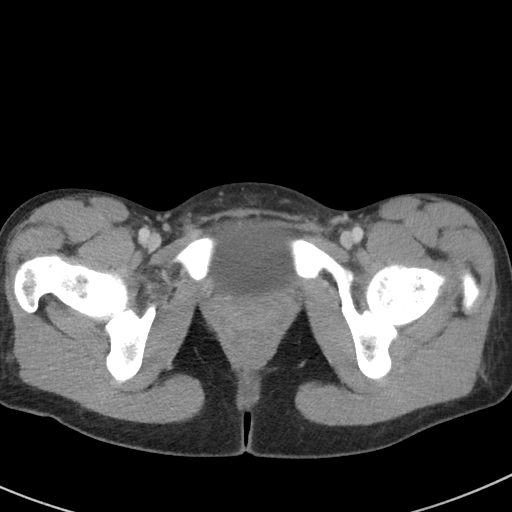
[im 19/82  soft-tissue]
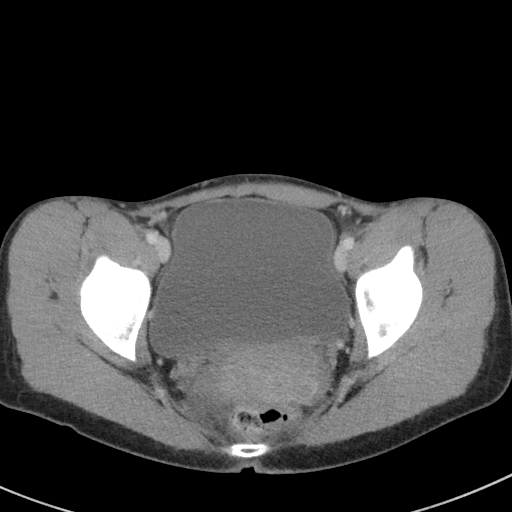
[im 26/82  soft-tissue]
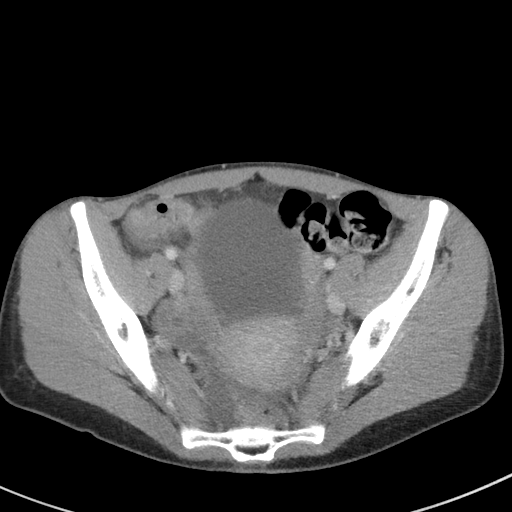
[im 30/82  soft-tissue]
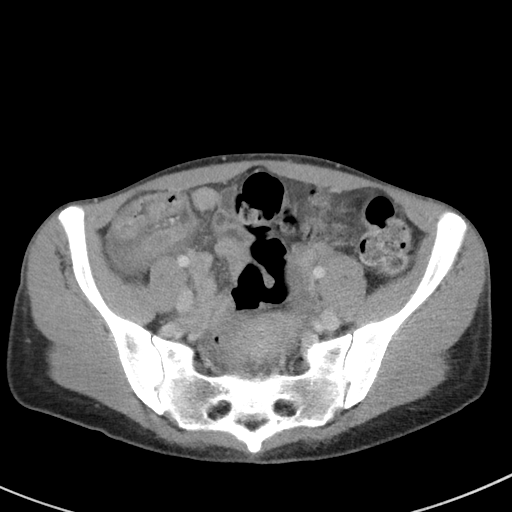
[im 37/82  soft-tissue]
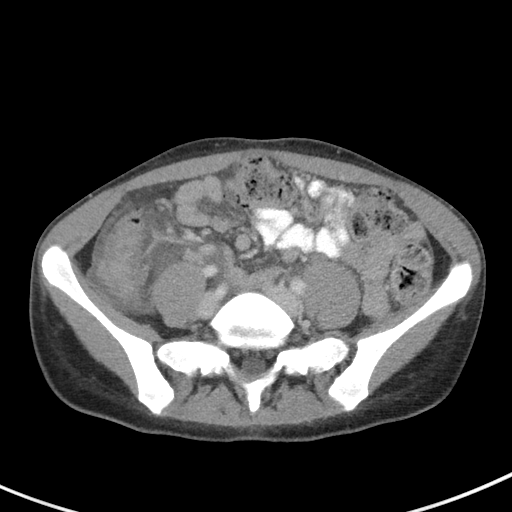
[im 45/82  soft-tissue]
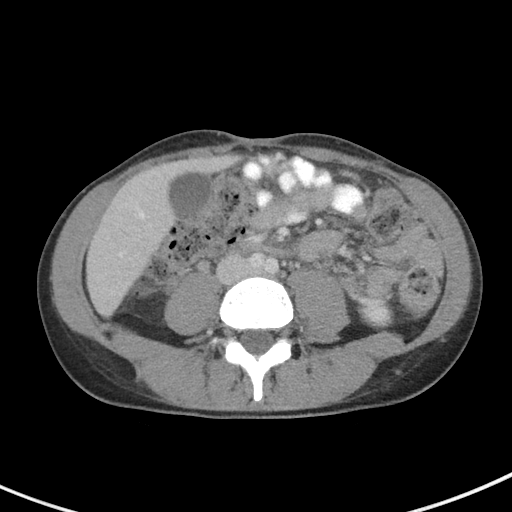
[im 52/82  soft-tissue]
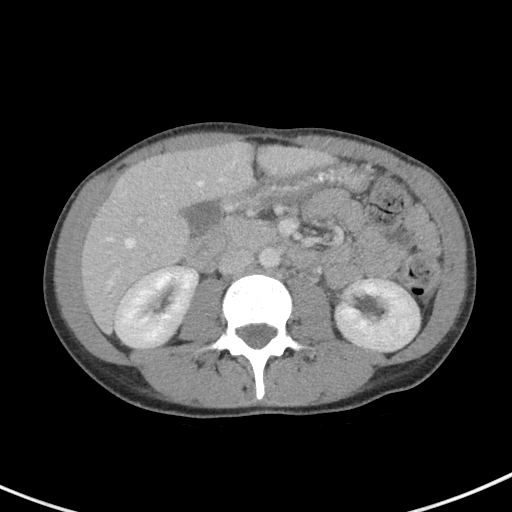
[im 56/82  soft-tissue]
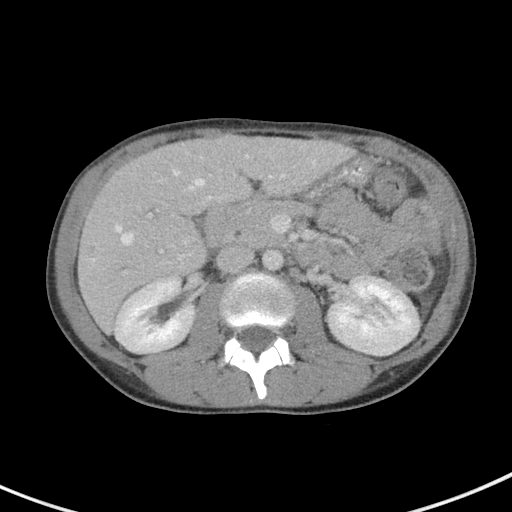
[im 56/82  bone]
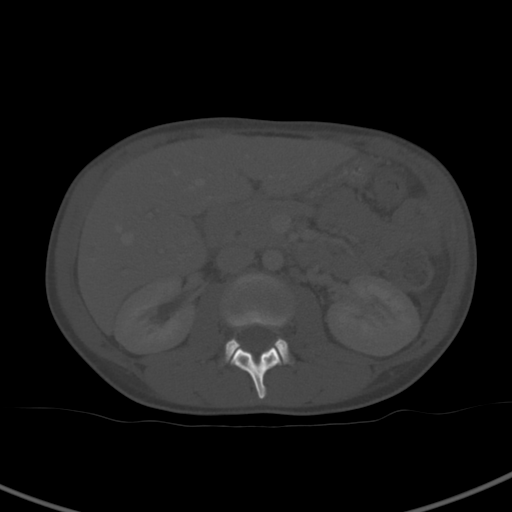
[im 63/82  soft-tissue]
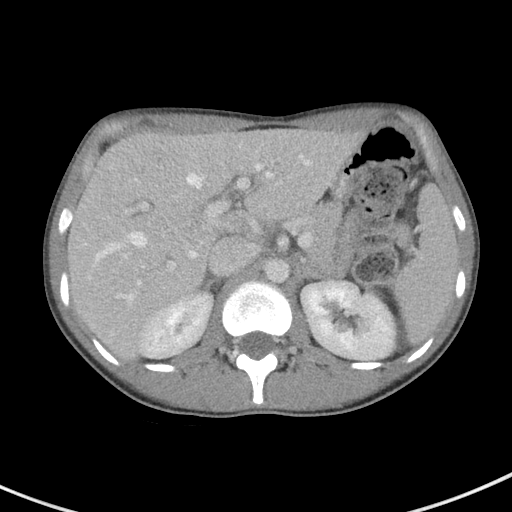
[im 70/82  soft-tissue]
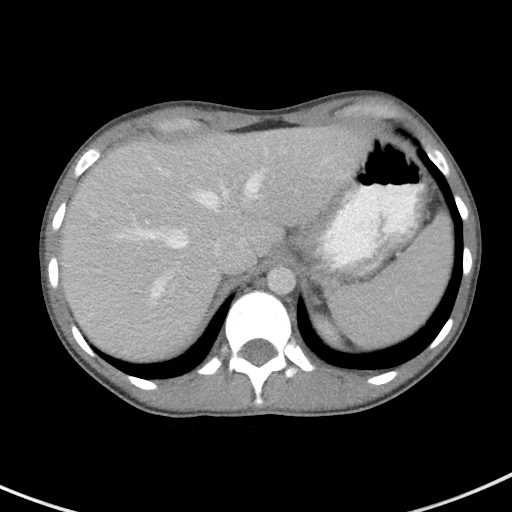
[im 78/82  soft-tissue]
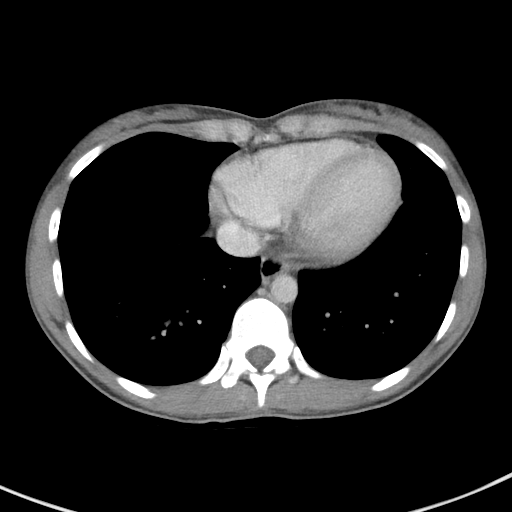

[Series 5: coronal st · coronal · 0.62mm/px · 3 of 66 slices shown]
[im 22/66  soft-tissue]
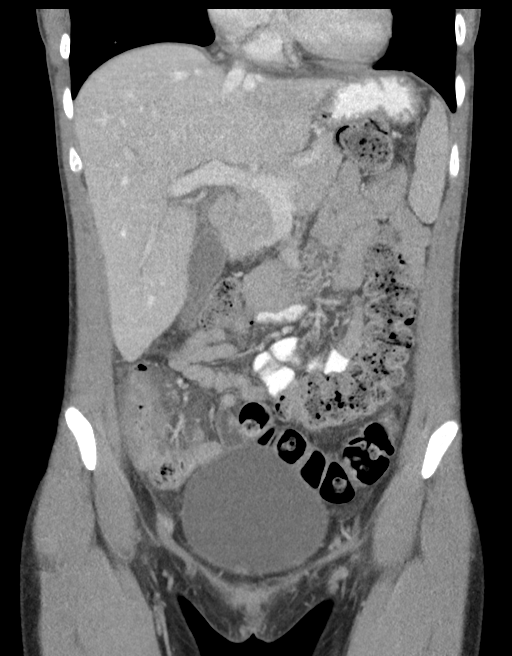
[im 29/66  soft-tissue]
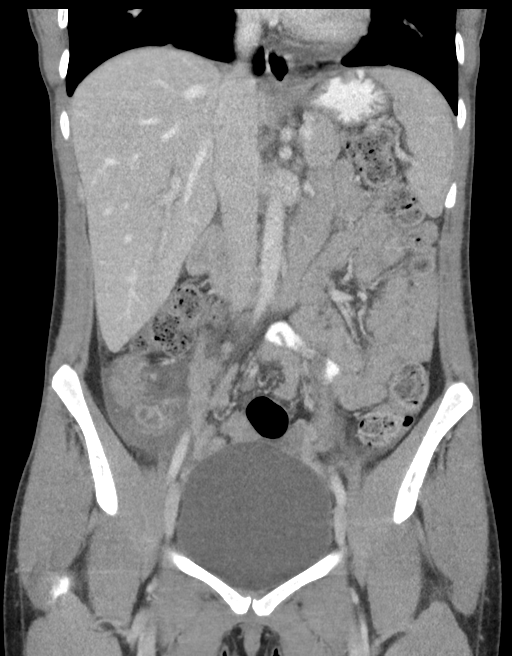
[im 37/66  soft-tissue]
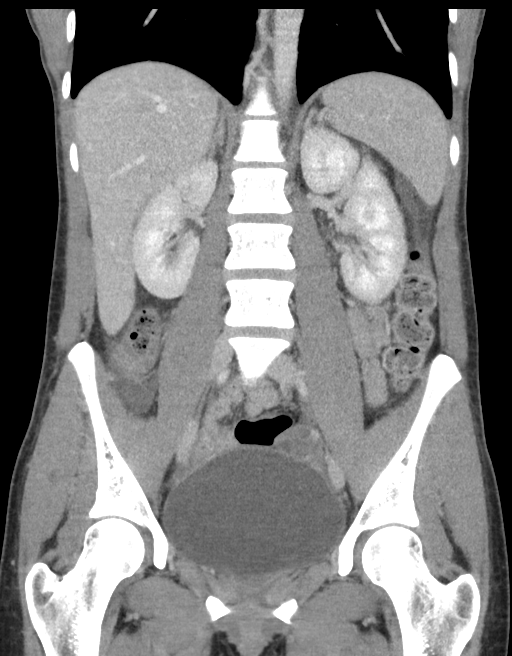

[15 of 46 positions shown; findings below may reference images not displayed]

FINDINGS: Lower chest: Lung bases are clear.

Hepatobiliary: No focal liver lesions are evident. There is mild
periportal edema. Gallbladder wall is not appreciably thickened.
There is no appreciable biliary duct dilatation.

Pancreas: There is no pancreatic mass or inflammatory focus.

Spleen: No splenic lesions evident.

Adrenals/Urinary Tract: Adrenals bilaterally appear normal. Kidneys
bilaterally show no evident mass or hydronephrosis on either side.
There is no evident renal or ureteral calculus on either side.
Urinary bladder is midline with wall thickness within normal limits.

Stomach/Bowel: There is moderate stool throughout the colon. There
is no appreciable bowel wall or mesenteric thickening. There is no
evident bowel obstruction. The terminal ileum appears unremarkable.
No free air or portal venous air.

Vascular/Lymphatic: No abdominal aortic aneurysm. No vascular
lesions evident. No adenopathy appreciable in the abdomen or pelvis.

Reproductive: The uterus is anteverted. There is no appreciable
pelvic mass. There is moderate fluid in the cul-de-sac region,
however.

Other: The appendiceal wall is thickened to 1.4 cm. There is
enhancement of the appendiceal wall. There is fluid surrounding the
appendix. There is no frank abscess or perforation appreciable.

Elsewhere there is no abscess in the abdomen or pelvis.

Musculoskeletal: No blastic or lytic bone lesions. No intramuscular
or abdominal wall lesion.
IMPRESSION: 1.  Findings indicative of acute appendicitis.

Appendix: Location: Appendix arises from the cecum and extends
inferior and slightly lateral to the cecum in the upper to mid
pelvis on the right.

Diameter: Up to 14 mm

Appendicolith: None

Mucosal hyper-enhancement: Present

Extraluminal gas: None

Periappendiceal collection: Moderate period no frank abscess.

2. There is mild periportal edema of uncertain etiology. Question
parenchymal liver disease as cause. Appropriate laboratory
correlation in this regard advised.

3. Moderate free fluid in the cul-de-sac. Question fluid secondary
to the appendicitis versus recent ovarian cyst rupture.

4.  No bowel obstruction.  No abscess in the abdomen or pelvis.

5. No renal or ureteral calculus. No hydronephrosis. Urinary bladder
wall thickness normal.

Critical Value/emergent results were called by telephone at the time
of interpretation on 09/14/2018 at [DATE] to Dr. ERVA NUR IMAL ,
who verbally acknowledged these results.
